# Patient Record
Sex: Female | Born: 1980 | Race: White | Hispanic: No | Marital: Single | Smoking: Former smoker
Health system: Southern US, Community
[De-identification: ages and names within clinical notes are randomized; demographics above are authoritative.]

## PROBLEM LIST (undated history)

## (undated) ENCOUNTER — Inpatient Hospital Stay (HOSPITAL_COMMUNITY): Payer: Self-pay

## (undated) DIAGNOSIS — B999 Unspecified infectious disease: Secondary | ICD-10-CM

## (undated) DIAGNOSIS — F32A Depression, unspecified: Secondary | ICD-10-CM

## (undated) DIAGNOSIS — F99 Mental disorder, not otherwise specified: Secondary | ICD-10-CM

## (undated) DIAGNOSIS — K589 Irritable bowel syndrome without diarrhea: Secondary | ICD-10-CM

## (undated) DIAGNOSIS — N926 Irregular menstruation, unspecified: Secondary | ICD-10-CM

## (undated) DIAGNOSIS — F419 Anxiety disorder, unspecified: Secondary | ICD-10-CM

## (undated) DIAGNOSIS — F329 Major depressive disorder, single episode, unspecified: Secondary | ICD-10-CM

## (undated) HISTORY — DX: Anxiety disorder, unspecified: F41.9

## (undated) HISTORY — DX: Irritable bowel syndrome, unspecified: K58.9

## (undated) HISTORY — PX: APPENDECTOMY: SHX54

## (undated) HISTORY — DX: Unspecified infectious disease: B99.9

## (undated) HISTORY — DX: Mental disorder, not otherwise specified: F99

## (undated) HISTORY — DX: Depression, unspecified: F32.A

## (undated) HISTORY — DX: Irregular menstruation, unspecified: N92.6

## (undated) HISTORY — PX: OTHER SURGICAL HISTORY: SHX169

## (undated) HISTORY — DX: Major depressive disorder, single episode, unspecified: F32.9

---
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
## 2003-09-25 ENCOUNTER — Inpatient Hospital Stay (HOSPITAL_COMMUNITY): Admission: RE | Admit: 2003-09-25 | Discharge: 2003-09-30 | Payer: Self-pay | Admitting: Psychiatry

## 2003-09-25 ENCOUNTER — Emergency Department (HOSPITAL_COMMUNITY): Admission: EM | Admit: 2003-09-25 | Discharge: 2003-09-25 | Payer: Self-pay | Admitting: Emergency Medicine

## 2003-12-09 ENCOUNTER — Other Ambulatory Visit: Admission: RE | Admit: 2003-12-09 | Discharge: 2003-12-09 | Payer: Self-pay | Admitting: Internal Medicine

## 2004-06-06 HISTORY — PX: WISDOM TOOTH EXTRACTION: SHX21

## 2004-11-16 ENCOUNTER — Other Ambulatory Visit: Admission: RE | Admit: 2004-11-16 | Discharge: 2004-11-16 | Payer: Self-pay | Admitting: Internal Medicine

## 2005-04-06 ENCOUNTER — Encounter: Admission: RE | Admit: 2005-04-06 | Discharge: 2005-04-06 | Payer: Self-pay | Admitting: Internal Medicine

## 2005-12-12 ENCOUNTER — Other Ambulatory Visit: Admission: RE | Admit: 2005-12-12 | Discharge: 2005-12-12 | Payer: Self-pay | Admitting: Internal Medicine

## 2005-12-15 ENCOUNTER — Encounter: Admission: RE | Admit: 2005-12-15 | Discharge: 2005-12-15 | Payer: Self-pay | Admitting: Internal Medicine

## 2007-04-15 IMAGING — CT CT ABDOMEN W/ CM
2 of 5 series · 17 of 46 positions shown, 19 images · IV contrast (READICAT/WATER & [ID] OMNI 300)
Comparison: none

CLINICAL DATA: 25-year-old female, right lower quadrant pain.  History of previous intestinal surgery and appendectomy at 5 weeks of age. 
ABDOMEN CT WITH CONTRAST:
TECHNIQUE: Multidetector CT imaging of the abdomen was performed following the standard protocol during bolus administration of intravenous contrast.
Contrast:  100 cc Omnipaque 300
TECHNIQUE: Multidetector CT imaging of the pelvis was performed following the standard protocol during bolus administration of intravenous contrast.

[Series 102: a&p w/ · axial · 0.62mm/px · z∈[-375,+12]mm · 14 of 420 slices shown, 16 images]
[im 17/420  soft-tissue]
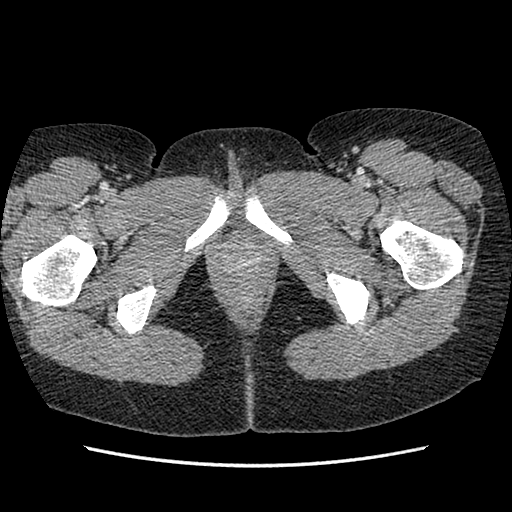
[im 17/420  bone]
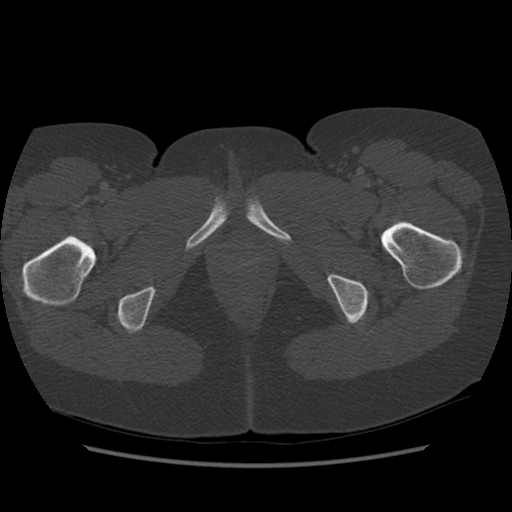
[im 51/420  soft-tissue]
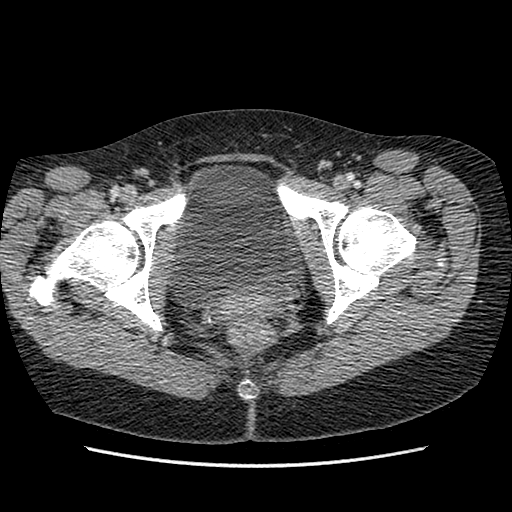
[im 84/420  soft-tissue]
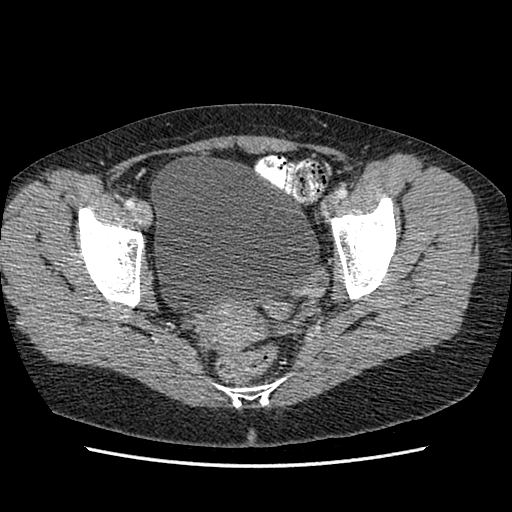
[im 118/420  soft-tissue]
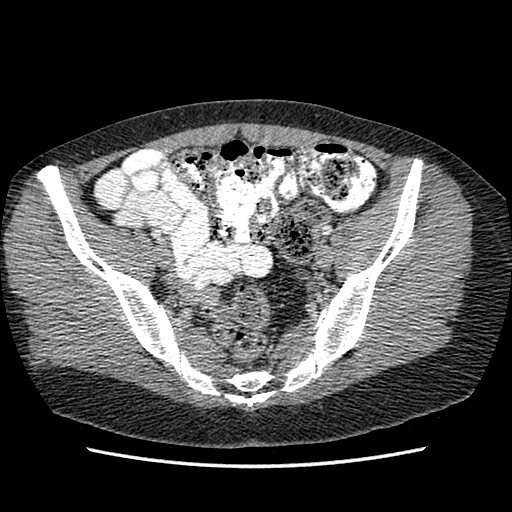
[im 135/420  soft-tissue]
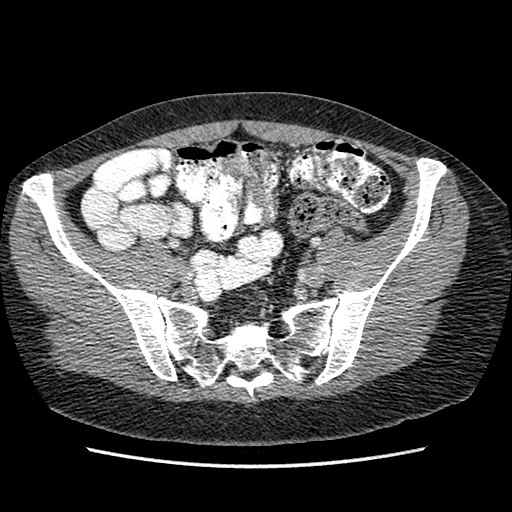
[im 168/420  soft-tissue]
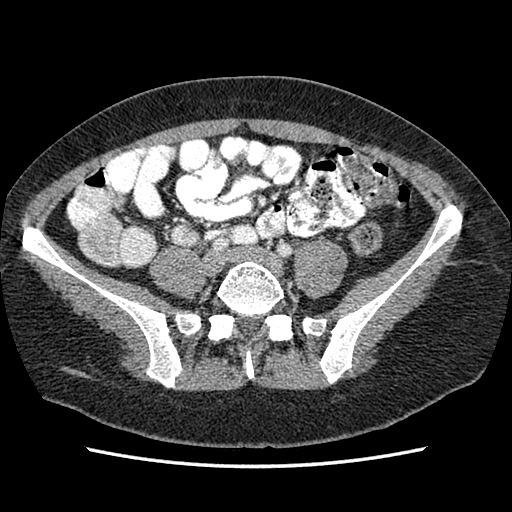
[im 202/420  soft-tissue]
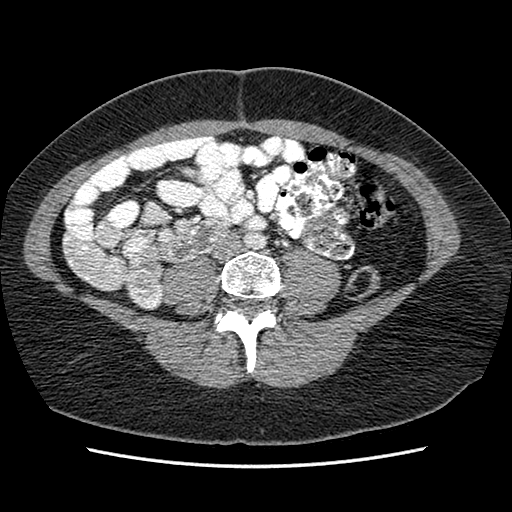
[im 218/420  soft-tissue]
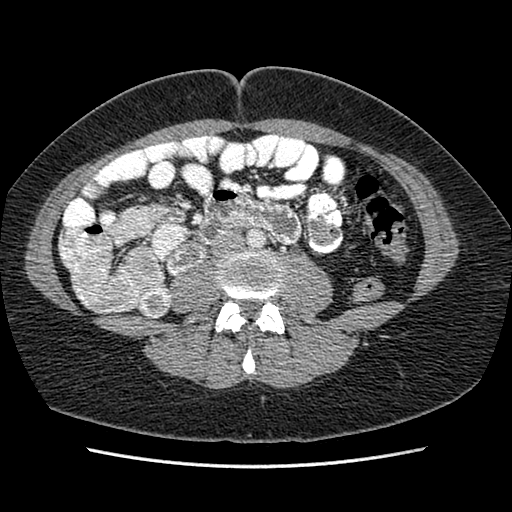
[im 252/420  soft-tissue]
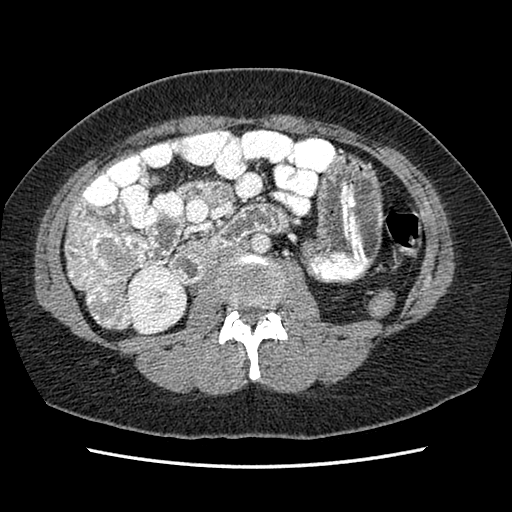
[im 252/420  bone]
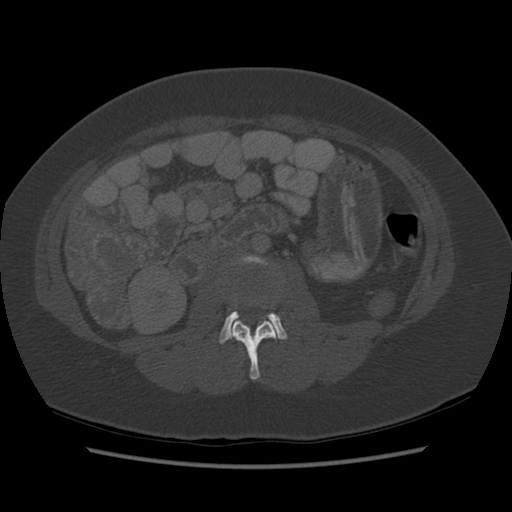
[im 285/420  soft-tissue]
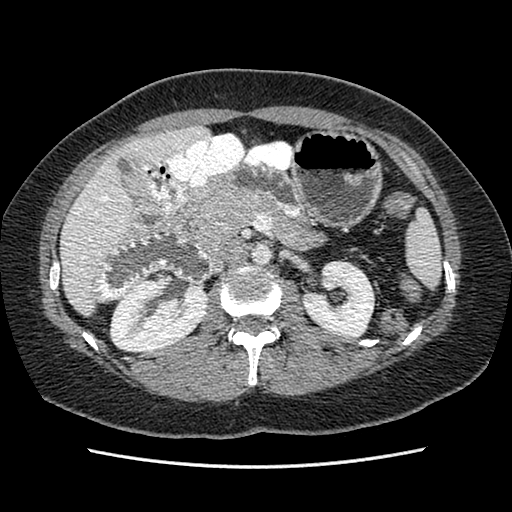
[im 319/420  soft-tissue]
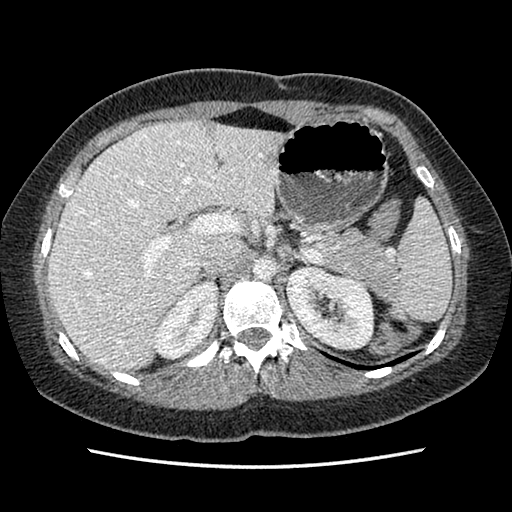
[im 336/420  soft-tissue]
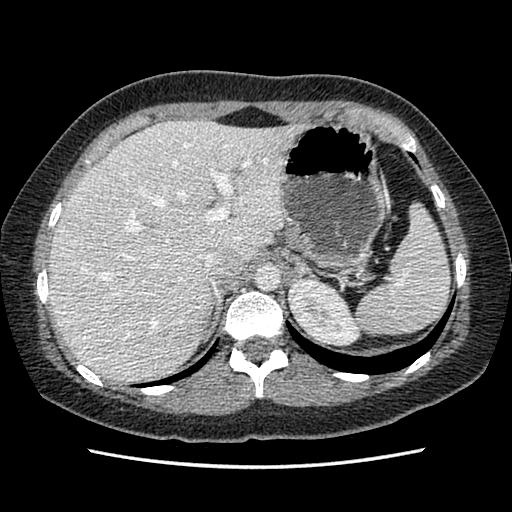
[im 369/420  soft-tissue]
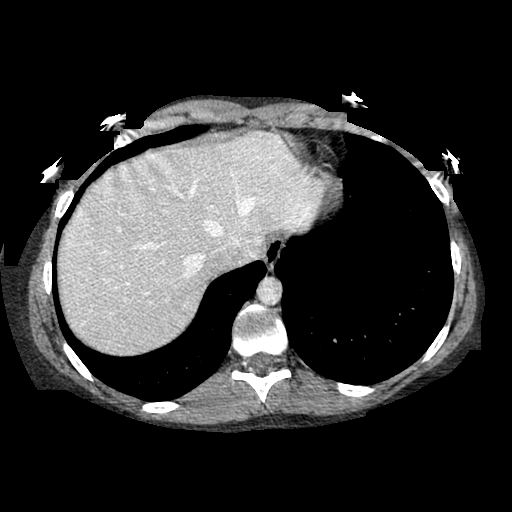
[im 403/420  soft-tissue]
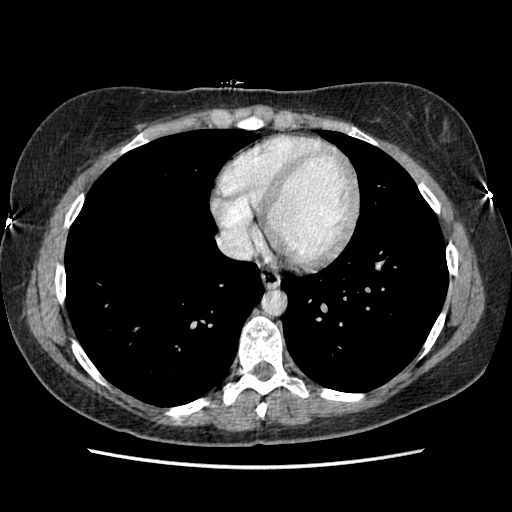

[Series 104: reformatted · coronal · 0.84mm/px · 3 of 79 slices shown]
[im 27/79  soft-tissue]
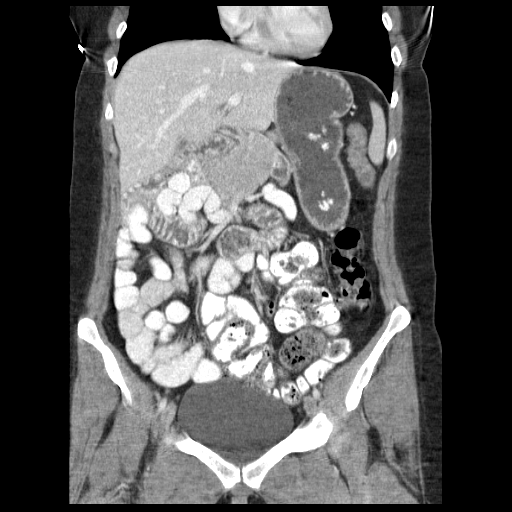
[im 35/79  soft-tissue]
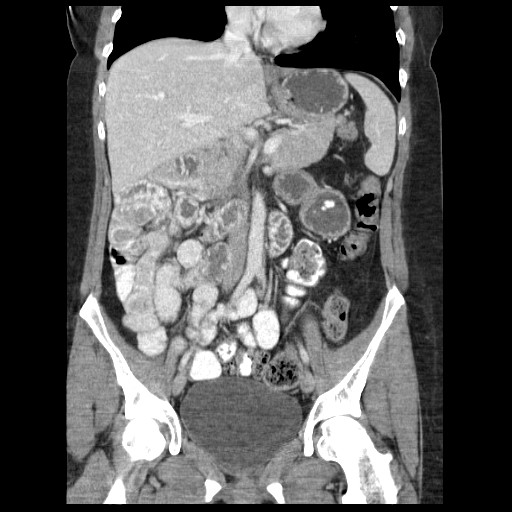
[im 44/79  soft-tissue]
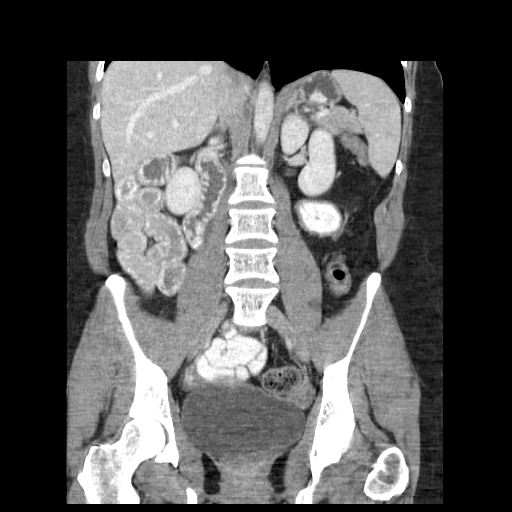

[17 of 46 positions shown; findings below may reference images not displayed]

FINDINGS: Heart size within normal limits.  Subglandular breast implants are evident bilaterally.  There is no significant pleural or pericardial effusion. 
The infused appearance of the liver is within normal limits.  Spleen and pancreas are normal.  Gallbladder is unremarkable.  Adrenal glands and kidneys are within normal limits bilaterally.  No significant abdominal lymphadenopathy or free fluid.  Small bowel is nearly all to the right of midline.  A single loop crosses midline before crossing back.  Large bowel is primarily on the left with the most proximal colon being low in the pelvis centrally.  Bone windows demonstrate no focal lytic or blastic lesions.
IMPRESSION: 1.   Malrotation of the bowel.
2.  No focal pathology to explain the patient?s pain.  
PELVIS CT WITH CONTRAST:
FINDINGS: As stated the large bowel is primarily on the left.  There is contrast material throughout the proximal colon.  Distal colon is partially collapsed although there is no focal transition point to suggest obstruction.  Urinary bladder is moderately distended.  Uterus and adnexa are unremarkable.  There is no significant pelvic free fluid or lymphadenopathy.
IMPRESSION: Malrotation of the bowel.  No focal pathology to explain the patient?s abdominal pain.

## 2008-01-08 ENCOUNTER — Other Ambulatory Visit: Admission: RE | Admit: 2008-01-08 | Discharge: 2008-01-08 | Payer: Self-pay | Admitting: Internal Medicine

## 2008-03-31 ENCOUNTER — Ambulatory Visit: Payer: Self-pay | Admitting: Internal Medicine

## 2008-06-06 DIAGNOSIS — F99 Mental disorder, not otherwise specified: Secondary | ICD-10-CM

## 2008-06-06 HISTORY — DX: Mental disorder, not otherwise specified: F99

## 2008-06-23 ENCOUNTER — Ambulatory Visit: Payer: Self-pay | Admitting: Internal Medicine

## 2008-12-04 ENCOUNTER — Ambulatory Visit: Payer: Self-pay | Admitting: Internal Medicine

## 2008-12-15 ENCOUNTER — Ambulatory Visit: Payer: Self-pay | Admitting: Internal Medicine

## 2008-12-15 ENCOUNTER — Other Ambulatory Visit: Admission: RE | Admit: 2008-12-15 | Discharge: 2008-12-15 | Payer: Self-pay | Admitting: Internal Medicine

## 2009-03-25 ENCOUNTER — Ambulatory Visit (HOSPITAL_COMMUNITY): Admission: RE | Admit: 2009-03-25 | Discharge: 2009-03-25 | Payer: Self-pay | Admitting: Orthopedic Surgery

## 2010-03-29 ENCOUNTER — Ambulatory Visit: Payer: Self-pay | Admitting: Internal Medicine

## 2010-03-29 ENCOUNTER — Other Ambulatory Visit: Admission: RE | Admit: 2010-03-29 | Discharge: 2010-03-29 | Payer: Self-pay | Admitting: Internal Medicine

## 2010-03-30 LAB — HM PAP SMEAR

## 2010-04-05 ENCOUNTER — Ambulatory Visit: Payer: Self-pay | Admitting: Internal Medicine

## 2010-10-22 NOTE — Discharge Summary (Signed)
NAME:  Mckenzie Gordon, MELKONIAN NO.:  [id number]   MEDICAL RECORD NO.:  [id number]                   PATIENT TYPE:  IPS   LOCATION:  0500                                 FACILITY:  [facility]   PHYSICIAN:  Geoffery Lyons, M.D.                   DATE OF BIRTH:  [birth year]   DATE OF ADMISSION:  09/25/2003  DATE OF DISCHARGE:  09/30/2003                                 DISCHARGE SUMMARY   CHIEF COMPLAINT AND PRESENT ILLNESS:  This was the first admission to [facility] for this 30 year old white female voluntarily  admitted.  She had a history of drug use, smoking crack for two years, crack  binge for four days.  She had a history of mood swings.  She denied any  suicidal or homicidal ideas.  She wanted help to get off substances.  She  reported racing thoughts.  She had a history of hypomania.   PAST PSYCHIATRIC HISTORY:  This was the first time at [facility].  She had inpatient rehabilitation in [place] in November 2004 for 27  days for crack cocaine.  She had a history of depression, history of  cutting.  She overdosed on Vicodin, psychotic.   SUBSTANCE ABUSE HISTORY:  The patient smoked.  Last drank one beer the day  before.  Crack cocaine, history of heroin, Lortab, ecstasy, methamphetamine,  PCP, Xanax.   PAST MEDICAL HISTORY:  History of essential tremors.   MEDICATIONS:  1. Propranolol 40 mg per day.  2. Advair twice a day.  3. Albuterol inhaler twice a day.   LABORATORY DATA:  Hemoglobin A1c 5.6.  Triglycerides 167.  RPR was  nonreactive.   MENTAL STATUS EXAM:  Mental status exam revealed an alert female,  cooperative, fair eye contact.  Speech was clear, goal oriented;  normal  rate, production, and tempo.  Mood: Anxiety.  Affect: Anxiety.  Thought  processes: Coherent; no evidence of psychosis.  Cognitive: Cognition was  well preserved.   ADMISSION DIAGNOSES:   AXIS I:  1. Cocaine dependence.  2. Polysubstance  abuse.  3. Mood disorder, not otherwise specified.   AXIS II:  No diagnosis.   AXIS III:  Essential tremors.   AXIS IV:  Moderate.   AXIS V:  Global assessment of functioning upon admission 35, highest global  assessment of functioning in the last year 60.   HOSPITAL COURSE:  She was admitted and started in intensive individual and  group psychotherapy.  She was given Ambien for sleep.  She was given  Symmetrel 100 mg twice a day.  She was detoxified with clonidine.  She was  given Zyprexa as needed.  She endorsed that she relapsed, could not say why.  She felt that her family was burnt out and they would not be helping her any  more.  She wanted to go to a residential treatment center.  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  She was dealing with the  shame and the guilt, the anxiety.  She reported hearing some voices but they  were muffled.  There was a family session where the family pretty much said  that she was not going to be allowed to go back to the house.  She became  upset.  It finally hit her that she was on her own.  She was willing to  pursue outpatient treatment, wanted to go to a residential treatment center.  There were no further hallucinations; psychosis probably secondary to the  use of cocaine.  She was more insightful afraid of relapse but was able to  open up and was dealing with triggers, relapse prevention plan.  On April  26, she was in full contact with reality.  There were no suicidal ideas, no  homicidal ideas.  Her mood had improved, her affect was brighter.  A bed  became available in [facility] and she was going to pursue further  treatment through [facility].   DISCHARGE DIAGNOSES:   AXIS I:  1. Polysubstance abuse.  2. Mood disorder, not otherwise specified.  3. Substance-induced psychotic symptoms.   AXIS II:  No diagnosis.   AXIS III:  Essential tremor.   AXIS IV:   Moderate.   AXIS V:  Global assessment of functioning upon discharge 50.   DISCHARGE MEDICATIONS:  1. Symmetrel 100 mg twice a day.  2. Trazodone 100 mg at bedtime for sleep.  3. Propranolol 40 mg per day.   FOLLOW UP:  She was being transferred to [facility] for further  residential treatment.                                               Geoffery Lyons, M.D.    IL/MEDQ  D:  10/23/2003  T:  10/24/2003  Job:  [id number]

## 2010-12-23 ENCOUNTER — Encounter: Payer: Self-pay | Admitting: Internal Medicine

## 2010-12-27 ENCOUNTER — Other Ambulatory Visit: Payer: Self-pay | Admitting: Internal Medicine

## 2010-12-28 ENCOUNTER — Encounter: Payer: Self-pay | Admitting: Internal Medicine

## 2010-12-28 ENCOUNTER — Ambulatory Visit (INDEPENDENT_AMBULATORY_CARE_PROVIDER_SITE_OTHER): Payer: 59 | Admitting: Internal Medicine

## 2010-12-28 ENCOUNTER — Other Ambulatory Visit (HOSPITAL_COMMUNITY)
Admission: RE | Admit: 2010-12-28 | Discharge: 2010-12-28 | Disposition: A | Discharge status: Home / Self Care | Payer: 59 | Source: Ambulatory Visit | Attending: Internal Medicine | Admitting: Internal Medicine

## 2010-12-28 VITALS — BP 134/84 | HR 72 | Temp 98.4°F | Ht 63.0 in | Wt 165.0 lb

## 2010-12-28 DIAGNOSIS — N760 Acute vaginitis: Secondary | ICD-10-CM

## 2010-12-28 DIAGNOSIS — G43909 Migraine, unspecified, not intractable, without status migrainosus: Secondary | ICD-10-CM

## 2010-12-28 DIAGNOSIS — F1910 Other psychoactive substance abuse, uncomplicated: Secondary | ICD-10-CM

## 2010-12-28 DIAGNOSIS — N926 Irregular menstruation, unspecified: Secondary | ICD-10-CM

## 2010-12-28 DIAGNOSIS — F988 Other specified behavioral and emotional disorders with onset usually occurring in childhood and adolescence: Secondary | ICD-10-CM | POA: Insufficient documentation

## 2010-12-28 DIAGNOSIS — Z124 Encounter for screening for malignant neoplasm of cervix: Secondary | ICD-10-CM

## 2010-12-28 DIAGNOSIS — F1911 Other psychoactive substance abuse, in remission: Secondary | ICD-10-CM | POA: Insufficient documentation

## 2010-12-28 DIAGNOSIS — Z0000 Encounter for general adult medical examination without abnormal findings: Secondary | ICD-10-CM

## 2010-12-28 DIAGNOSIS — Z01419 Encounter for gynecological examination (general) (routine) without abnormal findings: Secondary | ICD-10-CM | POA: Insufficient documentation

## 2010-12-28 LAB — COMPREHENSIVE METABOLIC PANEL
ALT: 10 U/L (ref 0–35)
AST: 15 U/L (ref 0–37)
Albumin: 4 g/dL (ref 3.5–5.2)
Alkaline Phosphatase: 57 U/L (ref 39–117)
BUN: 14 mg/dL (ref 6–23)
CO2: 23 mEq/L (ref 19–32)
Calcium: 8.9 mg/dL (ref 8.4–10.5)
Chloride: 103 mEq/L (ref 96–112)
Creat: 0.68 mg/dL (ref 0.50–1.10)
Glucose, Bld: 83 mg/dL (ref 70–99)
Potassium: 4.3 mEq/L (ref 3.5–5.3)
Sodium: 137 mEq/L (ref 135–145)
Total Bilirubin: 0.1 mg/dL — ABNORMAL LOW (ref 0.3–1.2)
Total Protein: 6.6 g/dL (ref 6.0–8.3)

## 2010-12-28 LAB — LIPID PANEL
Cholesterol: 152 mg/dL (ref 0–200)
HDL: 52 mg/dL (ref >39)
LDL Cholesterol: 84 mg/dL (ref 0–99)
Total CHOL/HDL Ratio: 2.9 Ratio
Triglycerides: 81 mg/dL (ref <150)
VLDL: 16 mg/dL (ref 0–40)

## 2010-12-28 LAB — CBC WITH DIFFERENTIAL/PLATELET
Basophils Absolute: 0 10*3/uL (ref 0.0–0.1)
Basophils Relative: 0 % (ref 0–1)
Eosinophils Absolute: 0.1 10*3/uL (ref 0.0–0.7)
Eosinophils Relative: 1 % (ref 0–5)
HCT: 43 % (ref 36.0–46.0)
Hemoglobin: 14.2 g/dL (ref 12.0–15.0)
Lymphocytes Relative: 26 % (ref 12–46)
Lymphs Abs: 2.3 10*3/uL (ref 0.7–4.0)
MCH: 29 pg (ref 26.0–34.0)
MCHC: 33 g/dL (ref 30.0–36.0)
MCV: 87.9 fL (ref 78.0–100.0)
Monocytes Absolute: 0.4 10*3/uL (ref 0.1–1.0)
Monocytes Relative: 5 % (ref 3–12)
Neutro Abs: 6.1 10*3/uL (ref 1.7–7.7)
Neutrophils Relative %: 68 % (ref 43–77)
Platelets: 368 10*3/uL (ref 150–400)
RBC: 4.89 MIL/uL (ref 3.87–5.11)
RDW: 14.1 % (ref 11.5–15.5)
WBC: 8.9 10*3/uL (ref 4.0–10.5)

## 2010-12-28 LAB — TSH: TSH: 1.574 u[IU]/mL (ref 0.350–4.500)

## 2010-12-28 NOTE — Patient Instructions (Signed)
We will obtain GYN appointment for you to have evaluated her menstrual cycle irregularities. Have refilled Imitrex  for one year.

## 2010-12-28 NOTE — Progress Notes (Signed)
  Subjective:    Patient ID: Mckenzie Gordon, female    DOB: 1980/11/24, 30 y.o.   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 Patient has a prior history of substance abuse and has done well in recovery. Continues to see Francesca Oman for counseling. History of migraine headaches, irritable bowel syndrome, remote history of asthma. History of attention deficit disorder and takes Adderall 20 mg daily during the school year. She has recently become engaged to a recent Forensic psychologist. She is sexually active. She is on oral contraceptives but has been having some trouble with irregular menses and heavy bleeding. We will refer her to GYN physician for evaluation. Also has noted a tag in her vulvar area which may be condyloma. Chest appointment see dermatologist in the near future. She has some other areas in the left perineal area that could be early condyloma as well. Had breast augmentation in 2001 by Dr. Shon Hough. History of appendectomy. Has had the Gardasil series completed in 2008.    Review of Systems  Constitutional: Negative.   HENT: Negative.   Eyes: Negative.   Respiratory: Negative.   Cardiovascular: Negative.   Gastrointestinal: Negative.   Genitourinary:       Irreg menses despite being on OCP. Recent episode of heavy bleeding. Denies noncompliance with OCP  Musculoskeletal: Negative.   Neurological: Negative.   Hematological: Negative.   Psychiatric/Behavioral: Negative.        Objective:   Physical Exam  Constitutional: She is oriented to person, place, and time. She appears well-developed and well-nourished. No distress.  HENT:  Head: Normocephalic and atraumatic.  Right Ear: External ear normal.  Left Ear: External ear normal.  Nose: Nose normal.  Mouth/Throat: Oropharynx is clear and moist. No oropharyngeal exudate.  Eyes: Conjunctivae and EOM are normal. Pupils are equal, round,  and reactive to light. Right eye exhibits no discharge. Left eye exhibits no discharge. No scleral icterus.  Neck: Normal range of motion. Neck supple. No JVD present. No thyromegaly present.  Cardiovascular: Normal rate, regular rhythm and normal heart sounds.  Exam reveals no gallop.   No murmur heard. Pulmonary/Chest: Effort normal and breath sounds normal. No respiratory distress. She has no wheezes. She has no rales.       Breasts normal female  Abdominal: Soft. Bowel sounds are normal. She exhibits no distension and no mass. There is no tenderness. There is no rebound and no guarding.  Genitourinary: Uterus normal. Vaginal discharge found.       Pap taken. White vaginal discharge.   Small tag right perineal area and papular areas left perineal area that could be early condyloma. GC and chlamydia probes taken  Musculoskeletal: She exhibits no edema.  Lymphadenopathy:    She has no cervical adenopathy.  Neurological: She is alert and oriented to person, place, and time. She has normal reflexes. No cranial nerve deficit.  Skin: No rash noted. She is not diaphoretic.  Psychiatric: She has a normal mood and affect. Her behavior is normal. Judgment and thought content normal.          Assessment & Plan:  History of migraine headaches  History of substance abuse  Attention deficit disorder  Irregular menses  History of irritable bowel syndrome  History of asthma  Plan refill Imitrex 100 mg tablets one month supply with when necessary 1 year refill  Adderall 20 mg (not XR) 1 by mouth daily #90 no refill

## 2010-12-29 ENCOUNTER — Encounter: Payer: Self-pay | Admitting: Internal Medicine

## 2010-12-29 LAB — VITAMIN D 25 HYDROXY (VIT D DEFICIENCY, FRACTURES): Vit D, 25-Hydroxy: 33 ng/mL (ref 30–89)

## 2010-12-29 LAB — GC/CHLAMYDIA PROBE AMP, GENITAL
Chlamydia, DNA Probe: NEGATIVE
GC Probe Amp, Genital: NEGATIVE

## 2011-01-04 ENCOUNTER — Telehealth: Payer: Self-pay

## 2011-01-04 NOTE — Telephone Encounter (Signed)
Patient scheduled for appointment with Dr. Freda Jackson on August 9. She is aware of this appt, and records have been sent

## 2011-01-13 ENCOUNTER — Other Ambulatory Visit: Payer: Self-pay | Admitting: Obstetrics and Gynecology

## 2011-02-24 ENCOUNTER — Encounter: Payer: Self-pay | Admitting: Internal Medicine

## 2011-02-24 ENCOUNTER — Ambulatory Visit (INDEPENDENT_AMBULATORY_CARE_PROVIDER_SITE_OTHER): Payer: 59 | Admitting: Internal Medicine

## 2011-02-24 VITALS — BP 124/86 | HR 80 | Temp 97.9°F

## 2011-02-24 DIAGNOSIS — F4321 Adjustment disorder with depressed mood: Secondary | ICD-10-CM

## 2011-02-24 DIAGNOSIS — F419 Anxiety disorder, unspecified: Secondary | ICD-10-CM

## 2011-02-24 DIAGNOSIS — F341 Dysthymic disorder: Secondary | ICD-10-CM

## 2011-02-24 DIAGNOSIS — Z23 Encounter for immunization: Secondary | ICD-10-CM

## 2011-02-27 DIAGNOSIS — F329 Major depressive disorder, single episode, unspecified: Secondary | ICD-10-CM | POA: Insufficient documentation

## 2011-02-27 NOTE — Progress Notes (Signed)
  Subjective:    Patient ID: Mckenzie Gordon, female    DOB: 01/02/1981, 30 y.o.   MRN: [id number]  HPI patient with history of substance abuse and depression being counseled by Antoine Poche recently had a settlement regarding her mother's death in a motor vehicle accident. This finalized her mother's death for her and she has been quite upset and grieving. She is worried about this. Came to talk about it. She is on Wellbutrin as a mood stabilizer. Takes trazodone to sleep. Her fiance is worried. 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 Also began to have some second thoughts about being married. No suicidal ideations. History of migraine headaches and allergic rhinitis. History of attention deficit and takes Adderall to study.    Review of Systems     Objective:   Physical Exam grieving appropriately. Able to verbalize her feelings. Not suicidal.        Assessment & Plan:  Grief reaction  Anxiety depression  History of substance abuse  Appointment with Dr. Evelene Croon for medication consultation. Increase Klonopin to 3 times a day.

## 2011-04-08 ENCOUNTER — Other Ambulatory Visit: Payer: Self-pay | Admitting: Internal Medicine

## 2011-04-22 ENCOUNTER — Other Ambulatory Visit: Payer: Self-pay

## 2011-04-22 MED ORDER — AMPHETAMINE-DEXTROAMPHETAMINE 20 MG PO TABS: 20.0000 mg | ORAL_TABLET | Freq: Every day | ORAL | Status: DC

## 2011-04-22 NOTE — Telephone Encounter (Signed)
Documented in meds and orders

## 2011-04-22 NOTE — Telephone Encounter (Signed)
Written Rx for Adderall 20mg  (not XR) #90 one  By mouth Daily with no refill

## 2011-07-25 ENCOUNTER — Other Ambulatory Visit: Payer: Self-pay

## 2011-07-25 MED ORDER — AMPHETAMINE-DEXTROAMPHETAMINE 20 MG PO TABS: 20.0000 mg | ORAL_TABLET | Freq: Every day | ORAL | Status: DC

## 2011-08-15 ENCOUNTER — Other Ambulatory Visit: Payer: 59 | Admitting: Internal Medicine

## 2011-08-15 ENCOUNTER — Telehealth: Payer: Self-pay

## 2011-08-15 DIAGNOSIS — Z3201 Encounter for pregnancy test, result positive: Secondary | ICD-10-CM

## 2011-08-15 DIAGNOSIS — N912 Amenorrhea, unspecified: Secondary | ICD-10-CM

## 2011-08-15 LAB — HCG, QUANTITATIVE, PREGNANCY: hCG, Beta Chain, Quant, S: 173.8 m[IU]/mL

## 2011-08-15 NOTE — Telephone Encounter (Signed)
Patient here for serum Hcg today, with results of 173.8. Per Dr. Lenord Fellers, informed she is approximately 2-[redacted] weeks pregnant. Advised to stop her Adderall, continue Wellbutrin, and use Alprazolam very sparingly. Also needs to quit smoking. Will  Find Ob/Gyn on her own and make an appointment.

## 2011-08-19 ENCOUNTER — Encounter: Payer: Self-pay | Admitting: Internal Medicine

## 2011-08-19 ENCOUNTER — Ambulatory Visit (INDEPENDENT_AMBULATORY_CARE_PROVIDER_SITE_OTHER): Payer: 59 | Admitting: Internal Medicine

## 2011-08-19 VITALS — BP 126/76 | HR 80 | Temp 99.3°F | Wt 187.0 lb

## 2011-08-19 DIAGNOSIS — F1911 Other psychoactive substance abuse, in remission: Secondary | ICD-10-CM

## 2011-08-19 DIAGNOSIS — Z331 Pregnant state, incidental: Secondary | ICD-10-CM

## 2011-08-19 DIAGNOSIS — Z3490 Encounter for supervision of normal pregnancy, unspecified, unspecified trimester: Secondary | ICD-10-CM

## 2011-08-19 DIAGNOSIS — F1910 Other psychoactive substance abuse, uncomplicated: Secondary | ICD-10-CM

## 2011-08-19 DIAGNOSIS — B373 Candidiasis of vulva and vagina: Secondary | ICD-10-CM

## 2011-08-19 LAB — POCT URINALYSIS DIPSTICK
Bilirubin, UA: NEGATIVE
Blood, UA: NEGATIVE
Glucose, UA: NEGATIVE
Ketones, UA: NEGATIVE
Nitrite, UA: NEGATIVE
Protein, UA: NEGATIVE
Urobilinogen, UA: NEGATIVE
pH, UA: 7

## 2011-08-19 LAB — POCT WET PREP (WET MOUNT)

## 2011-08-19 NOTE — Patient Instructions (Addendum)
Use Gyne Lotrimin vaginal cream at bedtime in vagina 1 applicatorful for 7 days. Keep taking prenatal vitamins. Please keep appointment with obstetrician.

## 2011-08-19 NOTE — Progress Notes (Signed)
  Subjective:    Patient ID: Mckenzie Gordon, female    DOB: 1980-09-16, 31 y.o.   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 Was on birth control and still got pregnant. Denied noncompliance with birth control. She has a fianc. Likely to get married after Bar exam July 2013. 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  Patient has prior history of severe vaginitis several years ago. This has gotten better recently. Now has complained of vaginal itching and vaginal odor. Says she has had vaginal discharge.    Review of Systems     Objective:   Physical Exam inspection of vagina reveals the os to be closed; cream-colored vaginal discharge. Wet prep shows yeast. No clue cells.        Assessment & Plan:  Intrauterine pregnancy  Candida vaginitis  Plan: GYN Lotrimin vaginal cream each bedtime x7 days. No refill. Patient has not yet obtained appointment with obstetrician. We will try to get appointment set up today. She is on prenatal vitamins over-the-counter currently. She has stopped smoking.

## 2011-09-28 ENCOUNTER — Ambulatory Visit (INDEPENDENT_AMBULATORY_CARE_PROVIDER_SITE_OTHER): Payer: Medicaid Other | Admitting: Obstetrics and Gynecology

## 2011-09-28 DIAGNOSIS — Z331 Pregnant state, incidental: Secondary | ICD-10-CM

## 2011-09-28 LAB — POCT URINALYSIS DIPSTICK
Bilirubin, UA: NEGATIVE
Blood, UA: NEGATIVE
Glucose, UA: NEGATIVE
Ketones, UA: NEGATIVE
Leukocytes, UA: NEGATIVE
Protein, UA: NEGATIVE
Urobilinogen, UA: NEGATIVE
pH, UA: 8.5

## 2011-09-28 NOTE — Progress Notes (Signed)
Consulted with SL re:  Hx irreg menses and vag D/C. OK TO  Sched dating U/S and f/u 10/03/11 to accomodate pt's sched.  To call with any increase in symptoms, bleeding or pain.  Pt verbalizes comprehension. To schedule NOB w/u and 1st trimester screening based on U/S results.

## 2011-09-29 LAB — PRENATAL PANEL VII
Antibody Screen: NEGATIVE
Basophils Absolute: 0 10*3/uL (ref 0.0–0.1)
Basophils Relative: 0 % (ref 0–1)
Eosinophils Absolute: 0.1 10*3/uL (ref 0.0–0.7)
Eosinophils Relative: 1 % (ref 0–5)
HCT: 41.1 % (ref 36.0–46.0)
HIV: NONREACTIVE
Lymphocytes Relative: 19 % (ref 12–46)
Lymphs Abs: 1.9 10*3/uL (ref 0.7–4.0)
MCH: 29.1 pg (ref 26.0–34.0)
MCHC: 33.1 g/dL (ref 30.0–36.0)
MCV: 88 fL (ref 78.0–100.0)
Monocytes Absolute: 0.6 10*3/uL (ref 0.1–1.0)
Neutrophils Relative %: 75 % (ref 43–77)
Platelets: 390 10*3/uL (ref 150–400)
RDW: 13.9 % (ref 11.5–15.5)
Rh Type: POSITIVE
WBC: 10.3 10*3/uL (ref 4.0–10.5)

## 2011-09-30 LAB — CULTURE, OB URINE: Colony Count: NO GROWTH

## 2011-10-03 ENCOUNTER — Encounter: Payer: Self-pay | Admitting: Obstetrics and Gynecology

## 2011-10-03 ENCOUNTER — Ambulatory Visit (INDEPENDENT_AMBULATORY_CARE_PROVIDER_SITE_OTHER): Payer: Self-pay

## 2011-10-03 ENCOUNTER — Ambulatory Visit (INDEPENDENT_AMBULATORY_CARE_PROVIDER_SITE_OTHER): Payer: Self-pay | Admitting: Obstetrics and Gynecology

## 2011-10-03 VITALS — BP 100/66 | Wt 192.0 lb

## 2011-10-03 DIAGNOSIS — Z331 Pregnant state, incidental: Secondary | ICD-10-CM

## 2011-10-03 DIAGNOSIS — B379 Candidiasis, unspecified: Secondary | ICD-10-CM | POA: Insufficient documentation

## 2011-10-03 DIAGNOSIS — B49 Unspecified mycosis: Secondary | ICD-10-CM

## 2011-10-03 LAB — POCT WET PREP (WET MOUNT): Clue Cells Wet Prep Whiff POC: NEGATIVE

## 2011-10-03 LAB — US OB COMP LESS 14 WKS

## 2011-10-03 MED ORDER — TERCONAZOLE 0.4 % VA CREA: 1.0000 | TOPICAL_CREAM | Freq: Every day | VAGINAL | Status: AC

## 2011-10-03 NOTE — Progress Notes (Signed)
Pt c/o possible bacteria infection x 2wks

## 2011-10-03 NOTE — Progress Notes (Signed)
Reviewed EDC change to [place] date 11/19 [place] 10 6/7 week IUP, ovaries WNL  F/o for NOB visit. Lavera Guise, CNM

## 2011-10-03 NOTE — Progress Notes (Signed)
  EGBUS WNL cervix pink moist wet prep +hyphae, neg clue neg trich VVC RX terazol 7 baking soda bathes discussed .Lavera Guise, CNM

## 2011-10-13 ENCOUNTER — Encounter: Payer: Self-pay | Admitting: Obstetrics and Gynecology

## 2011-10-13 ENCOUNTER — Ambulatory Visit (INDEPENDENT_AMBULATORY_CARE_PROVIDER_SITE_OTHER): Payer: Medicaid Other | Admitting: Obstetrics and Gynecology

## 2011-10-13 VITALS — BP 100/62 | Ht 64.0 in | Wt 193.0 lb

## 2011-10-13 DIAGNOSIS — Z331 Pregnant state, incidental: Secondary | ICD-10-CM

## 2011-10-13 LAB — POCT WET PREP (WET MOUNT): pH: 5

## 2011-10-13 LAB — OB RESULTS CONSOLE GC/CHLAMYDIA
Chlamydia: NEGATIVE
Gonorrhea: NEGATIVE

## 2011-10-13 NOTE — Progress Notes (Signed)
C/o vaginal odor

## 2011-10-18 LAB — PAP IG, CT-NG, RFX HPV ASCU
Chlamydia Probe Amp: NEGATIVE
GC Probe Amp: NEGATIVE

## 2011-10-20 ENCOUNTER — Other Ambulatory Visit: Payer: Self-pay | Admitting: Obstetrics and Gynecology

## 2011-10-20 ENCOUNTER — Ambulatory Visit (INDEPENDENT_AMBULATORY_CARE_PROVIDER_SITE_OTHER): Payer: Medicaid Other

## 2011-10-20 DIAGNOSIS — Z1389 Encounter for screening for other disorder: Secondary | ICD-10-CM

## 2011-10-20 DIAGNOSIS — Z331 Pregnant state, incidental: Secondary | ICD-10-CM

## 2011-10-20 LAB — US OB COMP LESS 14 WKS

## 2011-11-10 ENCOUNTER — Encounter: Payer: Self-pay | Admitting: Obstetrics and Gynecology

## 2011-11-10 ENCOUNTER — Ambulatory Visit (INDEPENDENT_AMBULATORY_CARE_PROVIDER_SITE_OTHER): Payer: Medicaid Other | Admitting: Obstetrics and Gynecology

## 2011-11-10 VITALS — BP 112/68 | Temp 98.2°F | Wt 195.0 lb

## 2011-11-10 DIAGNOSIS — Z331 Pregnant state, incidental: Secondary | ICD-10-CM

## 2011-11-10 DIAGNOSIS — N898 Other specified noninflammatory disorders of vagina: Secondary | ICD-10-CM

## 2011-11-10 DIAGNOSIS — Z3490 Encounter for supervision of normal pregnancy, unspecified, unspecified trimester: Secondary | ICD-10-CM

## 2011-11-10 LAB — POCT WET PREP (WET MOUNT)
Clue Cells Wet Prep Whiff POC: NEGATIVE
Trichomonas Wet Prep HPF POC: NEGATIVE

## 2011-11-10 MED ORDER — TERCONAZOLE 0.4 % VA CREA: TOPICAL_CREAM | VAGINAL | Status: DC

## 2011-11-10 NOTE — Progress Notes (Signed)
1)Pt c/o thin/clear vaginal d/c with odor. Also has had some cramping.  Results for orders placed in visit on 11/10/11  POCT OSOM BVBLUE TEST      Component Value Range   Bacterial Vaginosis neg    POCT OSOM TRICHOMONAS RAPID TEST      Component Value Range   Trichomonas vaginalis neg    POCT WET PREP (WET MOUNT)      Component Value Range   Source Wet Prep POC       WBC, Wet Prep HPF POC       Bacteria Wet Prep HPF POC       BACTERIA WET PREP MORPHOLOGY POC       Clue Cells Wet Prep HPF POC None     CLUE CELLS WET PREP WHIFF POC Negative Whiff     Yeast Wet Prep HPF POC Many     KOH Wet Prep POC       Trichomonas Wet Prep HPF POC neg     pH 4.5    2) Hx panic attacks.  Has had only 2 during pregnancy.  No meds required.  Will observe only for now.  RECOMMENDATION: Terazol 7 for 14 days, then once weekly.  Partner treated with otc meds AFP today Anatomy US [facility] 1 hr glucola ASAP for recurrent monilia

## 2011-11-11 ENCOUNTER — Other Ambulatory Visit: Payer: Medicaid Other

## 2011-11-11 DIAGNOSIS — Z8619 Personal history of other infectious and parasitic diseases: Secondary | ICD-10-CM

## 2011-11-11 DIAGNOSIS — Z331 Pregnant state, incidental: Secondary | ICD-10-CM

## 2011-11-11 NOTE — Progress Notes (Unsigned)
Urine=Protein- trace            Glucose-negative

## 2011-11-14 LAB — ALPHA FETOPROTEIN, MATERNAL
AFP: 62 IU/mL
MoM for AFP: 1.63
Open Spina bifida: NEGATIVE
Osb Risk: 1:1920 {titer}

## 2011-11-24 ENCOUNTER — Telehealth: Payer: Self-pay | Admitting: Obstetrics and Gynecology

## 2011-11-24 NOTE — Telephone Encounter (Signed)
TC to pt.   States has had diarrhea and loose stools 3-6x/day for 1 week.   No other SX. T-?.  No other family members ill.   Pt does not feel ill.  Has not changed diet or tried any med.  Per MK advised clear liquids x 24 hours then progress to [employer].  May try Imodium as directed.  To check T.  Call if >100.4 or no improvement in 1-2 days following above instructions.  Pt verbalizes comprehension.

## 2011-11-24 NOTE — Telephone Encounter (Signed)
Triage/electronic

## 2011-12-05 ENCOUNTER — Ambulatory Visit (INDEPENDENT_AMBULATORY_CARE_PROVIDER_SITE_OTHER): Payer: Medicaid Other

## 2011-12-05 ENCOUNTER — Ambulatory Visit (INDEPENDENT_AMBULATORY_CARE_PROVIDER_SITE_OTHER): Payer: Medicaid Other | Admitting: Obstetrics and Gynecology

## 2011-12-05 ENCOUNTER — Other Ambulatory Visit: Payer: Self-pay

## 2011-12-05 ENCOUNTER — Encounter: Payer: Self-pay | Admitting: Obstetrics and Gynecology

## 2011-12-05 VITALS — BP 112/62 | Wt 197.0 lb

## 2011-12-05 DIAGNOSIS — L293 Anogenital pruritus, unspecified: Secondary | ICD-10-CM

## 2011-12-05 DIAGNOSIS — Z3689 Encounter for other specified antenatal screening: Secondary | ICD-10-CM

## 2011-12-05 DIAGNOSIS — N898 Other specified noninflammatory disorders of vagina: Secondary | ICD-10-CM

## 2011-12-05 DIAGNOSIS — Z331 Pregnant state, incidental: Secondary | ICD-10-CM

## 2011-12-05 DIAGNOSIS — Z3490 Encounter for supervision of normal pregnancy, unspecified, unspecified trimester: Secondary | ICD-10-CM

## 2011-12-05 NOTE — Progress Notes (Signed)
Pt states she has thick discharge but it is clear, no complaints today.

## 2011-12-05 NOTE — Progress Notes (Signed)
U/S normal anatomy, female gender, cervix 3.86cm, FHR 148, nl fluid, posterior placenta, c/w dates Spec no abnl discharge Results for orders placed in visit on 11/11/11  GLUCOSE TOLERANCE, 1 HOUR      Component Value Range   Glucose, 1 Hour GTT 134  70 - 140 mg/dL  wet prep Results for orders placed in visit on 12/05/11  POCT WET PREP (WET MOUNT)      Component Value Range   Source Wet Prep POC       WBC, Wet Prep HPF POC       Bacteria Wet Prep HPF POC       BACTERIA WET PREP MORPHOLOGY POC       Clue Cells Wet Prep HPF POC       CLUE CELLS WET PREP WHIFF POC Negative Whiff     Yeast Wet Prep HPF POC       KOH Wet Prep POC       Trichomonas Wet Prep HPF POC       pH 4.0     Limited anatomy scan repeat at [facility] in 4wks

## 2011-12-06 ENCOUNTER — Telehealth: Payer: Self-pay

## 2011-12-06 LAB — POCT WET PREP (WET MOUNT): Clue Cells Wet Prep Whiff POC: NEGATIVE

## 2011-12-06 NOTE — Telephone Encounter (Signed)
Spoke to pt to let her know that I have scheduled an U/S at the next visit for F/U anatomy. A couple of areas just need better views; nothing to worry about. Pt understands. Melody Comas A

## 2011-12-20 LAB — US OB COMP + 14 WK

## 2012-01-06 ENCOUNTER — Ambulatory Visit (INDEPENDENT_AMBULATORY_CARE_PROVIDER_SITE_OTHER): Payer: Medicaid Other | Admitting: Obstetrics and Gynecology

## 2012-01-06 ENCOUNTER — Encounter: Payer: Self-pay | Admitting: Obstetrics and Gynecology

## 2012-01-06 ENCOUNTER — Ambulatory Visit (INDEPENDENT_AMBULATORY_CARE_PROVIDER_SITE_OTHER): Payer: Medicaid Other

## 2012-01-06 VITALS — BP 108/78 | Wt 205.0 lb

## 2012-01-06 DIAGNOSIS — Z3689 Encounter for other specified antenatal screening: Secondary | ICD-10-CM

## 2012-01-06 DIAGNOSIS — Z3480 Encounter for supervision of other normal pregnancy, unspecified trimester: Secondary | ICD-10-CM

## 2012-01-06 LAB — US OB FOLLOW UP

## 2012-01-06 NOTE — Progress Notes (Signed)
Pt with SOB with bending down and occ with lying down Physical Examination: General appearance - alert, well appearing, and in no distress Chest - clear to auscultation, no wheezes, rales or rhonchi, symmetric air entry CV RRR [place] for f/u anatomy EFW 1-14  cx 3.38 cm.  SIUP vtx normal cardiac views, NB and profile Pt reassured no signs of respiratory  distress glucola @[facility] 

## 2012-01-06 NOTE — Progress Notes (Signed)
Pt c/o SOB

## 2012-01-06 NOTE — Patient Instructions (Signed)
Fetal Movement Counts Patient Name: __________________________________________________ Patient Due Date: ____________________ Kick counts is highly recommended in high risk pregnancies, but it is a good idea for every pregnant woman to do. Start counting fetal movements at 28 weeks of the pregnancy. Fetal movements increase after eating a full meal or eating or drinking something sweet (the blood sugar is higher). It is also important to drink plenty of fluids (well hydrated) before doing the count. Lie on your left side because it helps with the circulation or you can sit in a comfortable chair with your arms over your belly (abdomen) with no distractions around you. DOING THE COUNT  Try to do the count the same time of day each time you do it.   Mark the day and time, then see how long it takes for you to feel 10 movements (kicks, flutters, swishes, rolls). You should have at least 10 movements within 2 hours. You will most likely feel 10 movements in much less than 2 hours. If you do not, wait an hour and count again. After a couple of days you will see a pattern.   What you are looking for is a change in the pattern or not enough counts in 2 hours. Is it taking longer in time to reach 10 movements?  SEEK MEDICAL CARE IF:  You feel less than 10 counts in 2 hours. Tried twice.   No movement in one hour.   The pattern is changing or taking longer each day to reach 10 counts in 2 hours.   You feel the baby is not moving as it usually does.  Date: ____________ Movements: ____________ Start time: ____________ Finish time: ____________  Date: ____________ Movements: ____________ Start time: ____________ Finish time: ____________ Date: ____________ Movements: ____________ Start time: ____________ Finish time: ____________ Date: ____________ Movements: ____________ Start time: ____________ Finish time: ____________ Date: ____________ Movements: ____________ Start time: ____________ Finish time:  ____________ Date: ____________ Movements: ____________ Start time: ____________ Finish time: ____________ Date: ____________ Movements: ____________ Start time: ____________ Finish time: ____________ Date: ____________ Movements: ____________ Start time: ____________ Finish time: ____________  Date: ____________ Movements: ____________ Start time: ____________ Finish time: ____________ Date: ____________ Movements: ____________ Start time: ____________ Finish time: ____________ Date: ____________ Movements: ____________ Start time: ____________ Finish time: ____________ Date: ____________ Movements: ____________ Start time: ____________ Finish time: ____________ Date: ____________ Movements: ____________ Start time: ____________ Finish time: ____________ Date: ____________ Movements: ____________ Start time: ____________ Finish time: ____________ Date: ____________ Movements: ____________ Start time: ____________ Finish time: ____________  Date: ____________ Movements: ____________ Start time: ____________ Finish time: ____________ Date: ____________ Movements: ____________ Start time: ____________ Finish time: ____________ Date: ____________ Movements: ____________ Start time: ____________ Finish time: ____________ Date: ____________ Movements: ____________ Start time: ____________ Finish time: ____________ Date: ____________ Movements: ____________ Start time: ____________ Finish time: ____________ Date: ____________ Movements: ____________ Start time: ____________ Finish time: ____________ Date: ____________ Movements: ____________ Start time: ____________ Finish time: ____________  Date: ____________ Movements: ____________ Start time: ____________ Finish time: ____________ Date: ____________ Movements: ____________ Start time: ____________ Finish time: ____________ Date: ____________ Movements: ____________ Start time: ____________ Finish time: ____________ Date: ____________ Movements:  ____________ Start time: ____________ Finish time: ____________ Date: ____________ Movements: ____________ Start time: ____________ Finish time: ____________ Date: ____________ Movements: ____________ Start time: ____________ Finish time: ____________ Date: ____________ Movements: ____________ Start time: ____________ Finish time: ____________  Date: ____________ Movements: ____________ Start time: ____________ Finish time: ____________ Date: ____________ Movements: ____________ Start time: ____________ Finish time: ____________ Date: ____________ Movements: ____________ Start time:   ____________ Finish time: ____________ Date: ____________ Movements: ____________ Start time: ____________ Finish time: ____________ Date: ____________ Movements: ____________ Start time: ____________ Finish time: ____________ Date: ____________ Movements: ____________ Start time: ____________ Finish time: ____________ Date: ____________ Movements: ____________ Start time: ____________ Finish time: ____________  Date: ____________ Movements: ____________ Start time: ____________ Finish time: ____________ Date: ____________ Movements: ____________ Start time: ____________ Finish time: ____________ Date: ____________ Movements: ____________ Start time: ____________ Finish time: ____________ Date: ____________ Movements: ____________ Start time: ____________ Finish time: ____________ Date: ____________ Movements: ____________ Start time: ____________ Finish time: ____________ Date: ____________ Movements: ____________ Start time: ____________ Finish time: ____________ Date: ____________ Movements: ____________ Start time: ____________ Finish time: ____________  Date: ____________ Movements: ____________ Start time: ____________ Finish time: ____________ Date: ____________ Movements: ____________ Start time: ____________ Finish time: ____________ Date: ____________ Movements: ____________ Start time: ____________ Finish  time: ____________ Date: ____________ Movements: ____________ Start time: ____________ Finish time: ____________ Date: ____________ Movements: ____________ Start time: ____________ Finish time: ____________ Date: ____________ Movements: ____________ Start time: ____________ Finish time: ____________ Date: ____________ Movements: ____________ Start time: ____________ Finish time: ____________  Date: ____________ Movements: ____________ Start time: ____________ Finish time: ____________ Date: ____________ Movements: ____________ Start time: ____________ Finish time: ____________ Date: ____________ Movements: ____________ Start time: ____________ Finish time: ____________ Date: ____________ Movements: ____________ Start time: ____________ Finish time: ____________ Date: ____________ Movements: ____________ Start time: ____________ Finish time: ____________ Date: ____________ Movements: ____________ Start time: ____________ Finish time: ____________ Document Released: 06/22/2006 Document Revised: 05/12/2011 Document Reviewed: 12/23/2008 Exit[employer] Patient Information 2012 [employer]. 

## 2012-02-03 ENCOUNTER — Encounter: Payer: Self-pay | Admitting: Obstetrics and Gynecology

## 2012-02-03 ENCOUNTER — Ambulatory Visit (INDEPENDENT_AMBULATORY_CARE_PROVIDER_SITE_OTHER): Payer: Medicaid Other | Admitting: Obstetrics and Gynecology

## 2012-02-03 ENCOUNTER — Other Ambulatory Visit: Payer: Medicaid Other

## 2012-02-03 VITALS — BP 118/78 | Wt 210.0 lb

## 2012-02-03 DIAGNOSIS — Z331 Pregnant state, incidental: Secondary | ICD-10-CM

## 2012-02-03 NOTE — Patient Instructions (Signed)
Fetal Monitoring, Fetal Movement Assessment Fetal movement assessment (FMA) is done by the pregnant woman herself by counting and recording the baby's movements over a certain time period. It is done to see if there are problems with the pregnancy and the baby. Identifying and correcting problems may prevent serious problems from developing with the fetus, including fetal loss. Some pregnancies are complicated by the mother's medical problems. Some of these problems are type 1 diabetes mellitus, high blood pressure and other chronic medical illnesses. This is why it is important to monitor the baby before birth.  OTHER TECHNIQUES OF MONITORING YOUR BABY BEFORE BIRTH Several tests are in use. These include:  Nonstress test (NST). This test monitors the baby's heart rate when the baby moves.   Contraction stress test (CST). This test monitors the baby's heart rate during a contraction of the uterus.   Fetal biophysical profile (BPP). This measures and evaluates 5 observations of the baby:   The nonstress test.   The baby's breathing.   The baby's movements.   The baby's muscle tone.   The amount of amniotic fluid.   Modified BPP. This measures the volume of fluid in different parts of the amniotic sac (amniotic fluid index) and the results of the nonstress test.   Umbilical artery doppler velocimetry. This evaluates the blood flow through the umbilical cord.  There are several very serious problems that cannot be predicted or detected with any of the fetal monitoring procedures. These problems include separation (abruption) of the placenta or when the fetus chokes on the umbilical cord (umbilical cord accident). Your caregiver will help you understand your tests and what they mean for you and your baby. It is your responsibility to obtain the results of your test. LET YOUR CAREGIVER KNOW ABOUT:   Any medications you are taking including prescription and over-the-counter drugs, herbs, eye  drops and creams.   If you have a fever.   If you have an infection.   If you are sick.  RISKS AND COMPLICATIONS  There are no risks or complications to the mother or fetus with FMA. BEFORE THE PROCEDURE  Do not take medications that may decrease or increase the baby's heart rate and/or movements.   Eat a full meal at least 2 hours before the test.   Do not smoke if you are pregnant. If you smoke, stop at least 2 days before the test. It is best not to smoke at all when you are pregnant.  PROCEDURE Sometimes, a mother notices her baby moves less before there are problems. Because of this, it is believed that fetal movement checking by the mother (kick counts) is a good way to check the baby before birth. There are different ways of doing this. Two good ways are:  The woman lies on her side and counts distinct (individual) fetal movements. A feeling of 10 distinct movements in a period of up to 2 hours is considered reassuring. When 10 movements are felt, you may stop counting.   Women are instructed to count fetal movements for 1 hour, three times per week. The count is good if, after one week, it equals or is over the woman's previously established baseline count. If the count is lower, further checking of your baby is needed.  AFTER THE PROCEDURE You may resume your usual activities. HOME CARE INSTRUCTIONS   Follow your caregiver's advice and recommendations.   Be aware of your baby's movements. Are they normal, less than usual or more than usual?     Make and keep the rest of your prenatal appointments.  SEEK MEDICAL CARE IF:   You develop a temperature of 100 F (37.8 C) or higher.   You have a bloody mucus discharge from the vagina (a bloody show).  SEEK IMMEDIATE MEDICAL CARE IF:   You do not feel the baby move.   You think the baby's movements are too little or too many.   You develop contractions.   You develop vaginal bleeding.   You have belly (abdominal) pain.    You have leaking or a gush of fluid from the vagina.  Document Released: 05/13/2002 Document Revised: 05/12/2011 Document Reviewed: 09/15/2008 Exit[employer] Patient Information 2012 [employer]. 

## 2012-02-03 NOTE — Progress Notes (Signed)
A/P Glucola, hemoglobin and RPR today Fetal kick counts reviewed All patients questions answered Return in two weeks Continue Prenatal vitamins Blood type O positive

## 2012-02-03 NOTE — Progress Notes (Signed)
Glucola given at 10:00 Glucola due at 11:00

## 2012-02-17 ENCOUNTER — Encounter: Payer: Self-pay | Admitting: Obstetrics and Gynecology

## 2012-02-17 ENCOUNTER — Ambulatory Visit (INDEPENDENT_AMBULATORY_CARE_PROVIDER_SITE_OTHER): Payer: Medicaid Other | Admitting: Obstetrics and Gynecology

## 2012-02-17 VITALS — BP 110/64 | Wt 212.0 lb

## 2012-02-17 DIAGNOSIS — Z331 Pregnant state, incidental: Secondary | ICD-10-CM

## 2012-02-17 NOTE — Progress Notes (Signed)
Results for orders placed in visit on 02/03/12  GLUCOSE TOLERANCE, 1 HOUR (50G) W/O FASTING      Component Value Range   Glucose, 1 Hour GTT 112  70 - 140 mg/dL  HEMOGLOBIN      Component Value Range   Hemoglobin 11.9 (*) 12.0 - 15.0 g/dL  RPR      Component Value Range   RPR NON REAC  NON REAC Pt c/o reflux.  Try zantac prna nd reflux precautions given.  Reviewed results with the pt Continue FKC RT 2 weeks

## 2012-02-17 NOTE — Patient Instructions (Signed)
Heartburn During Pregnancy  Heartburn is a burning sensation in the chest caused by stomach acid backing up into the esophagus. Heartburn (also known as "reflux") is common in pregnancy because a certain hormone (progesterone) changes. The progesterone hormone may relax the valve that separates the esophagus from the stomach. This allows acid to go up into the esophagus, causing heartburn. Heartburn may also happen in pregnancy because the enlarging uterus pushes up on the stomach, which pushes more acid into the esophagus. This is especially true in the later stages of pregnancy. Heartburn problems usually go away after giving birth. CAUSES   The progesterone hormone.   Changing hormone levels.   The growing uterus that pushes stomach acid upward.   Large meals.   Certain foods and drinks.   Exercise.   Increased acid production.  SYMPTOMS   Burning pain in the chest or lower throat.   Bitter taste in the mouth.   Coughing.  DIAGNOSIS  Heartburn is typically diagnosed by your caregiver when taking a careful history of your concern. Your caregiver may order a blood test to check for a certain type of bacteria that is associated with heartburn. Sometimes, heartburn is diagnosed by prescribing a heartburn medicine to see if the symptoms improve. It is rare in pregnancy to have a procedure called an endoscopy. This is when a tube with a light and a camera on the end is used to examine the esophagus and the stomach. TREATMENT   Your caregiver may tell you to use certain over-the-counter medicines (antacids, acid reducers) for mild heartburn.   Your caregiver may prescribe medicines to decrease stomach acid or to protect your stomach lining.   Your caregiver may recommend certain diet changes.   For severe cases, your caregiver may recommend that the head of the bed be elevated on blocks. (Sleeping with more pillows is not an effective treatment as it only changes the position of your  head and does not improve the main problem of stomach acid refluxing into the esophagus.)  HOME CARE INSTRUCTIONS   Take all medicines as directed by your caregiver.   Raise the head of your bed by putting blocks under the legs if instructed to by your caregiver.   Do not exercise right after eating.   Avoid eating 2 or 3 hours before bed. Do not lie down right after eating.   Eat small meals throughout the day instead of 3 large meals.   Identify foods and beverages that make your symptoms worse and avoid them. Foods you may want to avoid include:   Peppers.   Chocolate.   High-fat foods, including fried foods.   Spicy foods.   Garlic and onions.   Citrus fruits, including oranges, grapefruit, lemons, and limes.   Food containing tomatoes or tomato products.   Mint.   Carbonated and caffeinated drinks.   Vinegar.  SEEK IMMEDIATE MEDICAL CARE IF:   You have severe chest pain that goes down your arm or into your jaw or neck.   You feel sweaty, dizzy, or lightheaded.   You become short of breath.   You vomit blood.   You have difficulty or pain with swallowing.   You have bloody or black, tarry stools.   You have episodes of heartburn more than 3 times a week, for more than 2 weeks.  MAKE SURE YOU:  Understand these instructions.   Will watch your condition.   Will get help right away if you are not doing well or   get worse.  Document Released: 05/20/2000 Document Revised: 05/12/2011 Document Reviewed: 11/11/2010 [facility] Patient Information 2012 [place], [place].

## 2012-03-01 DIAGNOSIS — O139 Gestational [pregnancy-induced] hypertension without significant proteinuria, unspecified trimester: Secondary | ICD-10-CM

## 2012-03-02 ENCOUNTER — Ambulatory Visit (INDEPENDENT_AMBULATORY_CARE_PROVIDER_SITE_OTHER): Payer: Medicaid Other | Admitting: Obstetrics and Gynecology

## 2012-03-02 ENCOUNTER — Encounter (HOSPITAL_COMMUNITY): Payer: Self-pay | Admitting: Obstetrics and Gynecology

## 2012-03-02 ENCOUNTER — Inpatient Hospital Stay (HOSPITAL_COMMUNITY)
Admission: AD | Admit: 2012-03-02 | Discharge: 2012-03-02 | Disposition: A | Discharge status: Home / Self Care | Payer: Medicaid Other | Source: Ambulatory Visit | Attending: Obstetrics and Gynecology | Admitting: Obstetrics and Gynecology

## 2012-03-02 ENCOUNTER — Encounter: Payer: Self-pay | Admitting: Obstetrics and Gynecology

## 2012-03-02 VITALS — BP 138/82 | Wt 215.0 lb

## 2012-03-02 DIAGNOSIS — O139 Gestational [pregnancy-induced] hypertension without significant proteinuria, unspecified trimester: Secondary | ICD-10-CM | POA: Diagnosis not present

## 2012-03-02 DIAGNOSIS — Z331 Pregnant state, incidental: Secondary | ICD-10-CM

## 2012-03-02 LAB — CBC
Hemoglobin: 12.1 g/dL (ref 12.0–15.0)
MCHC: 33.3 g/dL (ref 30.0–36.0)
Platelets: 331 10*3/uL (ref 150–400)
RBC: 4.38 MIL/uL (ref 3.87–5.11)

## 2012-03-02 LAB — COMPREHENSIVE METABOLIC PANEL
ALT: 13 U/L (ref 0–35)
AST: 16 U/L (ref 0–37)
Alkaline Phosphatase: 124 U/L — ABNORMAL HIGH (ref 39–117)
CO2: 25 mEq/L (ref 19–32)
Calcium: 9.6 mg/dL (ref 8.4–10.5)
GFR calc Af Amer: >90 mL/min (ref >90)
Glucose, Bld: 98 mg/dL (ref 70–99)
Potassium: 4.1 mEq/L (ref 3.5–5.1)
Sodium: 135 mEq/L (ref 135–145)
Total Protein: 7 g/dL (ref 6.0–8.3)

## 2012-03-02 LAB — URINALYSIS, ROUTINE W REFLEX MICROSCOPIC
Nitrite: NEGATIVE
Specific Gravity, Urine: 1.01 (ref 1.005–1.030)
Urobilinogen, UA: 0.2 mg/dL (ref 0.0–1.0)
pH: 6.5 (ref 5.0–8.0)

## 2012-03-02 LAB — URINE MICROSCOPIC-ADD ON

## 2012-03-02 NOTE — MAU Note (Signed)
Patient states she was seen in the office today for a regular visit. Blood pressure was elevated and she was sent to MAU for evaluation. States she has had allergy headaches for a couple of weeks. No visual problems. Reports good fetal movement. Denies any contractions, bleeding or leaking. Slight discharge.

## 2012-03-02 NOTE — MAU Provider Note (Signed)
History   31 yo G1P0 at 9 3/7 weeks presented from office for BP evaluation and NST.  Seen for regular appointment today, with c/o HA for 2 weeks.  BP slightly elevated at 140/90.  Sent to MAU for further evaluation.  Per Dr. Estanislado Pandy, if BP stable and labs WNL, to be sent home to do 24 hour urine.  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  Moving to [place] [employer] after delivery.  Patient Active Problem List  Diagnosis  . History of substance abuse  . Migraine headache  . Attention deficit disorder  . Anxiety and depression  . Yeast infection     Chief Complaint  Patient presents with  . Hypertension    OB History    Grav Para Term Preterm Abortions TAB SAB Ect Mult Living   1               Past Medical History  Diagnosis Date  . IBS (irritable bowel syndrome)   . Anxiety     KNONAPIN DR Lenord Fellers  . Migraine     IMITRX  . Asthma     CHILDHOOD EXERCISE INDUCED  . Mental disorder 2010    ADHD  . Depression AGE 31    WELBUTRIN  . Infection     UTI HX FREQUENT  . Infection     YEAST AND BV OCC  . Infection     CHLAMYDIA  . Irregular menses     Past Surgical History  Procedure Date  . Breast surgery 12/1999    augmentation   . Wisdom tooth extraction 2006  . 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 Intestine surgery     Family History  Problem Relation Age of Onset  . Hypertension Maternal Uncle   . Hypertension Maternal Grandmother   . Heart disease Maternal Grandfather   . Hypertension Maternal Grandfather   . Other Maternal Grandfather     HEPATITIS B  . Hypertension Mother   . Alcohol abuse Mother   . Other Father     EPILEPSY  . Other Paternal Grandmother     EPILEPSY  . Hypertension Paternal Grandfather   . Other Paternal Uncle     EPILEPSY  . Other Cousin     EPILEPSY  . Rheum arthritis Cousin     History  Substance Use Topics  . Smoking status: Former Smoker -- 0.3 packs/day for 15  years    Types: Cigarettes    Quit date: 08/15/2011  . Smokeless tobacco: Never Used  . Alcohol Use: Yes     social    Allergies: No Known Allergies  Prescriptions prior to admission  Medication Sig Dispense Refill  . Prenatal Vit-Fe Fumarate-FA (PRENATAL MULTIVITAMIN) TABS Take 1 tablet by mouth daily. OTC      . terconazole (TERAZOL 7) 0.4 % vaginal cream Pt to insert 1 applicator full qhs x 14 days  45 g  0     Physical Exam   Blood pressure 130/83, pulse 101, temperature 98.6 F (37 C), temperature source Oral, resp. rate 16, height 5\' 3"  (1.6 m), weight 212 lb 9.6 oz (96.435 kg), last menstrual period 06/21/2011, SpO2 100.00%.   ED Course  IUP at 32 3/7 weeks PIH w/u  Plan: NST, serial BPs, PIH labs, UA Currently no room in MAU--will check labs and await room availablity in MAU for further evaluation.   Nigel Bridgeman CNM, [place] 03/02/2012 1:26 PM  Addendum: Now in exam room in MAU.   Filed Vitals:   03/02/12 1351 03/02/12 1403 03/02/12 1418 03/02/12 1433  BP: 137/80 115/71 124/79 123/88  Pulse: 94 89 99 106  Temp:      TempSrc:      Resp:      Height:      Weight:      SpO2:       Results for orders placed during the hospital encounter of 03/02/12 (from the past 24 hour(s))  URINALYSIS, ROUTINE W REFLEX MICROSCOPIC     Status: Abnormal   Collection Time   03/02/12 12:50 PM      Component Value Range   Color, Urine YELLOW  YELLOW   APPearance CLEAR  CLEAR   Specific Gravity, Urine 1.010  1.005 - 1.030   pH 6.5  5.0 - 8.0   Glucose, UA NEGATIVE  NEGATIVE mg/dL   Hgb urine dipstick NEGATIVE  NEGATIVE   Bilirubin Urine NEGATIVE  NEGATIVE   Ketones, ur NEGATIVE  NEGATIVE mg/dL   Protein, ur NEGATIVE  NEGATIVE mg/dL   Urobilinogen, UA 0.2  0.0 - 1.0 mg/dL   Nitrite NEGATIVE  NEGATIVE   Leukocytes, UA SMALL (*) NEGATIVE  URINE MICROSCOPIC-ADD ON     Status: Abnormal   Collection Time   03/02/12 12:50 PM      Component Value Range   Squamous Epithelial /  LPF FEW (*) RARE   WBC, UA 3-6  <3 WBC/hpf   Bacteria, UA RARE  RARE  CBC     Status: Abnormal   Collection Time   03/02/12 12:55 PM      Component Value Range   WBC 16.5 (*) 4.0 - 10.5 K/uL   RBC 4.38  3.87 - 5.11 MIL/uL   Hemoglobin 12.1  12.0 - 15.0 g/dL   HCT 96.0  45.4 - 09.8 %   MCV 82.9  78.0 - 100.0 fL   MCH 27.6  26.0 - 34.0 pg   MCHC 33.3  30.0 - 36.0 g/dL   RDW 11.9  14.7 - 82.9 %   Platelets 331  150 - 400 K/uL  COMPREHENSIVE METABOLIC PANEL     Status: Abnormal   Collection Time   03/02/12 12:55 PM      Component Value Range   Sodium 135  135 - 145 mEq/L   Potassium 4.1  3.5 - 5.1 mEq/L   Chloride 101  96 - 112 mEq/L   CO2 25  19 - 32 mEq/L   Glucose, Bld 98  70 - 99 mg/dL   BUN 6  6 - 23 mg/dL   Creatinine, Ser 5.62 (*) 0.50 - 1.10 mg/dL   Calcium 9.6  8.4 - 13.0 mg/dL   Total Protein 7.0  6.0 - 8.3 g/dL   Albumin 2.9 (*) 3.5 - 5.2 g/dL   AST 16  0 - 37 U/L   ALT 13  0 - 35 U/L   Alkaline Phosphatase 124 (*) 39 - 117 U/L   Total Bilirubin 0.1 (*) 0.3 - 1.2 mg/dL   GFR calc non Af Amer >90  >90 mL/min   GFR calc Af Amer >90  >90 mL/min  LACTATE DEHYDROGENASE     Status: Normal   Collection Time   03/02/12 12:55 PM      Component Value Range   LDH 168  94 - 250 U/L  URIC ACID     Status: Normal   Collection Time   03/02/12 12:55 PM  Component Value Range   Uric Acid, Serum 3.7  2.4 - 7.0 mg/dL   BPs [id number]/95-62.  Chest clear Heart RRR without murmur Abd gravid, NT Pelvic--deferred Ext--DTR 2+ without clonus, 1+ edema.  FHR Category 1 No contractions.  D/C'd home with instructions to begin 24 hour urine on Saturday, complete Sunday, and bring to MAU for 24 hour urine protein evaluation, creatnine clearance, and creatnine.  Will do BP check on Sunday, as well. Patient able to rest at home--not working or in school at present. PIH precautions reviewed.  Nigel Bridgeman, CNM 03/02/12 3p

## 2012-03-02 NOTE — Progress Notes (Signed)
[redacted]w[redacted]d Pt c/o constant headache x 2 weeks.

## 2012-03-02 NOTE — Progress Notes (Signed)
[redacted]w[redacted]d GFM BP rechecked at 140/90: reports constant headache for 2 weeks, no visual symptoms, no epigastric pain To MAU for BP, PIH labs and NST  CNM called

## 2012-03-04 ENCOUNTER — Other Ambulatory Visit: Payer: Self-pay | Admitting: Obstetrics and Gynecology

## 2012-03-04 ENCOUNTER — Inpatient Hospital Stay (HOSPITAL_COMMUNITY)
Admission: AD | Admit: 2012-03-04 | Discharge: 2012-03-04 | Disposition: A | Discharge status: Home / Self Care | Payer: Medicaid Other | Source: Ambulatory Visit | Attending: Obstetrics and Gynecology | Admitting: Obstetrics and Gynecology

## 2012-03-04 DIAGNOSIS — O10019 Pre-existing essential hypertension complicating pregnancy, unspecified trimester: Secondary | ICD-10-CM | POA: Insufficient documentation

## 2012-03-04 DIAGNOSIS — O139 Gestational [pregnancy-induced] hypertension without significant proteinuria, unspecified trimester: Secondary | ICD-10-CM

## 2012-03-04 LAB — PROTEIN, URINE, 24 HOUR
Protein, 24H Urine: 108 mg/d — ABNORMAL HIGH (ref 50–100)
Protein, Urine: 6 mg/dL

## 2012-03-04 LAB — CREATININE CLEARANCE, URINE, 24 HOUR
Collection Interval-CRCL: 24 hours
Creatinine Clearance: 208 mL/min — ABNORMAL HIGH (ref 75–115)
Creatinine, Urine: 81.7 mg/dL
Urine Total Volume-CRCL: 1800 mL

## 2012-03-04 NOTE — MAU Note (Signed)
Patient to MAU to return a 24 hour urine specimen, have a blood pressure check and fetal heart tone.Denies any contractions or symptoms of elevated blood pressure.

## 2012-03-05 ENCOUNTER — Telehealth: Payer: Self-pay | Admitting: Obstetrics and Gynecology

## 2012-03-05 NOTE — Telephone Encounter (Signed)
TC from pt. Per VL, informed 24 hour urine result WNL. +FM.

## 2012-03-05 NOTE — Telephone Encounter (Signed)
Message copied by Mason Jim on Mon Mar 05, 2012  8:50 AM ------      Message from: Cornelius Moras      Created: Sun Mar 04, 2012  4:58 PM      Regarding: 24 hour urine       Please call patient in inform of 24 hour urine result WNL.            Thanks!

## 2012-03-05 NOTE — Telephone Encounter (Signed)
TC to pt. LM to return call.  

## 2012-03-05 NOTE — Telephone Encounter (Signed)
Message copied by Mason Jim on Mon Mar 05, 2012  9:54 AM ------      Message from: Cornelius Moras      Created: Sun Mar 04, 2012  4:58 PM      Regarding: 24 hour urine       Please call patient in inform of 24 hour urine result WNL.            Thanks!

## 2012-03-09 ENCOUNTER — Ambulatory Visit (INDEPENDENT_AMBULATORY_CARE_PROVIDER_SITE_OTHER): Payer: Medicaid Other | Admitting: Obstetrics and Gynecology

## 2012-03-09 VITALS — BP 120/64 | Wt 217.0 lb

## 2012-03-09 DIAGNOSIS — Z3490 Encounter for supervision of normal pregnancy, unspecified, unspecified trimester: Secondary | ICD-10-CM

## 2012-03-09 DIAGNOSIS — Z331 Pregnant state, incidental: Secondary | ICD-10-CM

## 2012-03-09 NOTE — Progress Notes (Signed)
[redacted]w[redacted]d No ha or visual changes today, pt thinks it was her allergies fkcs and labor precautions Several questions answered

## 2012-03-23 ENCOUNTER — Ambulatory Visit (INDEPENDENT_AMBULATORY_CARE_PROVIDER_SITE_OTHER): Payer: Medicaid Other | Admitting: Obstetrics and Gynecology

## 2012-03-23 ENCOUNTER — Encounter: Payer: Self-pay | Admitting: Obstetrics and Gynecology

## 2012-03-23 ENCOUNTER — Encounter: Payer: Medicaid Other | Admitting: Obstetrics and Gynecology

## 2012-03-23 VITALS — BP 126/78 | Temp 98.5°F | Wt 217.0 lb

## 2012-03-23 DIAGNOSIS — Z331 Pregnant state, incidental: Secondary | ICD-10-CM

## 2012-03-23 DIAGNOSIS — Z3490 Encounter for supervision of normal pregnancy, unspecified, unspecified trimester: Secondary | ICD-10-CM

## 2012-03-23 DIAGNOSIS — Z23 Encounter for immunization: Secondary | ICD-10-CM

## 2012-03-23 NOTE — Progress Notes (Signed)
[redacted]w[redacted]d Flu shot today No complaints FKCs GBS at [place] RTO 1wk

## 2012-03-30 ENCOUNTER — Encounter: Payer: Self-pay | Admitting: Obstetrics and Gynecology

## 2012-03-30 ENCOUNTER — Ambulatory Visit (INDEPENDENT_AMBULATORY_CARE_PROVIDER_SITE_OTHER): Payer: Medicaid Other | Admitting: Obstetrics and Gynecology

## 2012-03-30 VITALS — BP 124/68 | Wt 217.0 lb

## 2012-03-30 DIAGNOSIS — Z331 Pregnant state, incidental: Secondary | ICD-10-CM

## 2012-03-30 NOTE — Progress Notes (Signed)
Doing well.  Reviewed birth questions. Moving to [place] approx 1-2 weeks after delivery--has peds there already. Will use hospital peds at delivery. Desires cord blood donation. GBS today.  Cultures deferred. Cervix closed, 50%, vtx, -1 low in pelvis, cx posterior.

## 2012-03-30 NOTE — Progress Notes (Signed)
[redacted]w[redacted]d  GBS today.   

## 2012-04-01 LAB — STREP B DNA PROBE: GBSP: NEGATIVE

## 2012-04-04 ENCOUNTER — Encounter: Payer: Medicaid Other | Admitting: Obstetrics and Gynecology

## 2012-04-05 ENCOUNTER — Encounter: Payer: Self-pay | Admitting: Obstetrics and Gynecology

## 2012-04-05 ENCOUNTER — Ambulatory Visit (INDEPENDENT_AMBULATORY_CARE_PROVIDER_SITE_OTHER): Payer: Medicaid Other | Admitting: Obstetrics and Gynecology

## 2012-04-05 VITALS — BP 110/78 | Wt 213.0 lb

## 2012-04-05 DIAGNOSIS — Z331 Pregnant state, incidental: Secondary | ICD-10-CM

## 2012-04-05 DIAGNOSIS — Z3490 Encounter for supervision of normal pregnancy, unspecified, unspecified trimester: Secondary | ICD-10-CM

## 2012-04-05 NOTE — Progress Notes (Signed)
GBS negative. Doing well--decreased appetite, encouraged to maintain good fluid intake. Vtx low in pelvis.

## 2012-04-05 NOTE — Progress Notes (Signed)
[redacted]w[redacted]d Pt wants to know whether or not her mucous plug expelled. Concerned about 4lb wt loss this visit.

## 2012-04-06 ENCOUNTER — Inpatient Hospital Stay (HOSPITAL_COMMUNITY)
Admission: AD | Admit: 2012-04-06 | Discharge: 2012-04-10 | DRG: 765 | Disposition: A | Discharge status: Home / Self Care | Payer: Medicaid Other | Source: Ambulatory Visit | Attending: Obstetrics and Gynecology | Admitting: Obstetrics and Gynecology

## 2012-04-06 ENCOUNTER — Telehealth: Payer: Self-pay

## 2012-04-06 ENCOUNTER — Encounter (HOSPITAL_COMMUNITY): Payer: Self-pay | Admitting: *Deleted

## 2012-04-06 ENCOUNTER — Encounter (HOSPITAL_COMMUNITY): Payer: Self-pay | Admitting: Anesthesiology

## 2012-04-06 DIAGNOSIS — F32A Depression, unspecified: Secondary | ICD-10-CM | POA: Diagnosis present

## 2012-04-06 DIAGNOSIS — IMO0002 Reserved for concepts with insufficient information to code with codable children: Secondary | ICD-10-CM | POA: Diagnosis present

## 2012-04-06 DIAGNOSIS — F410 Panic disorder [episodic paroxysmal anxiety] without agoraphobia: Secondary | ICD-10-CM | POA: Diagnosis not present

## 2012-04-06 DIAGNOSIS — D649 Anemia, unspecified: Secondary | ICD-10-CM | POA: Diagnosis not present

## 2012-04-06 DIAGNOSIS — O1490 Unspecified pre-eclampsia, unspecified trimester: Secondary | ICD-10-CM | POA: Diagnosis present

## 2012-04-06 DIAGNOSIS — O9903 Anemia complicating the puerperium: Secondary | ICD-10-CM | POA: Diagnosis not present

## 2012-04-06 DIAGNOSIS — O139 Gestational [pregnancy-induced] hypertension without significant proteinuria, unspecified trimester: Secondary | ICD-10-CM

## 2012-04-06 DIAGNOSIS — Z98891 History of uterine scar from previous surgery: Secondary | ICD-10-CM

## 2012-04-06 DIAGNOSIS — F419 Anxiety disorder, unspecified: Secondary | ICD-10-CM | POA: Diagnosis present

## 2012-04-06 LAB — COMPREHENSIVE METABOLIC PANEL
ALT: 17 U/L (ref 0–35)
AST: 18 U/L (ref 0–37)
Albumin: 2.8 g/dL — ABNORMAL LOW (ref 3.5–5.2)
CO2: 21 mEq/L (ref 19–32)
Chloride: 99 mEq/L (ref 96–112)
Creatinine, Ser: 0.56 mg/dL (ref 0.50–1.10)
GFR calc non Af Amer: >90 mL/min (ref >90)
Sodium: 133 mEq/L — ABNORMAL LOW (ref 135–145)
Total Bilirubin: 0.2 mg/dL — ABNORMAL LOW (ref 0.3–1.2)

## 2012-04-06 LAB — URINE MICROSCOPIC-ADD ON

## 2012-04-06 LAB — URINALYSIS, ROUTINE W REFLEX MICROSCOPIC
Nitrite: NEGATIVE
Protein, ur: NEGATIVE mg/dL
Specific Gravity, Urine: 1.01 (ref 1.005–1.030)
Urobilinogen, UA: 0.2 mg/dL (ref 0.0–1.0)

## 2012-04-06 LAB — URIC ACID: Uric Acid, Serum: 4.9 mg/dL (ref 2.4–7.0)

## 2012-04-06 LAB — CBC
MCV: 81.4 fL (ref 78.0–100.0)
Platelets: 324 10*3/uL (ref 150–400)
RBC: 4.68 MIL/uL (ref 3.87–5.11)
RDW: 14.6 % (ref 11.5–15.5)
WBC: 11.9 10*3/uL — ABNORMAL HIGH (ref 4.0–10.5)

## 2012-04-06 LAB — TYPE AND SCREEN: ABO/RH(D): O POS

## 2012-04-06 LAB — ABO/RH: ABO/RH(D): O POS

## 2012-04-06 MED ORDER — ACETAMINOPHEN 325 MG PO TABS: 650.0000 mg | ORAL_TABLET | ORAL | Status: DC | PRN

## 2012-04-06 MED ORDER — LIDOCAINE HCL (PF) 1 % IJ SOLN: 30.0000 mL | INTRAMUSCULAR | Status: DC | PRN

## 2012-04-06 MED ORDER — MORPHINE SULFATE 10 MG/ML IJ SOLN: 10.0000 mg | Freq: Once | INTRAMUSCULAR | Status: DC

## 2012-04-06 MED ORDER — EPHEDRINE 5 MG/ML INJ
10.0000 mg | INTRAVENOUS | Status: DC | PRN
  Filled 2012-04-06: qty 4

## 2012-04-06 MED ORDER — LABETALOL HCL 5 MG/ML IV SOLN
10.0000 mg | INTRAVENOUS | Status: DC | PRN
  Administered 2012-04-06: 10 mg via INTRAVENOUS
  Filled 2012-04-06: qty 4
  Filled 2012-04-06: qty 8

## 2012-04-06 MED ORDER — PHENYLEPHRINE 40 MCG/ML (10ML) SYRINGE FOR IV PUSH (FOR BLOOD PRESSURE SUPPORT): 80.0000 ug | PREFILLED_SYRINGE | INTRAVENOUS | Status: DC | PRN

## 2012-04-06 MED ORDER — OXYTOCIN 40 UNITS IN LACTATED RINGERS INFUSION - SIMPLE MED
1.0000 m[IU]/min | INTRAVENOUS | Status: DC
  Administered 2012-04-06: 2 m[IU]/min via INTRAVENOUS
  Filled 2012-04-06: qty 1000

## 2012-04-06 MED ORDER — FENTANYL 2.5 MCG/ML BUPIVACAINE 1/10 % EPIDURAL INFUSION (WH - ANES)
INTRAMUSCULAR | Status: DC | PRN
  Administered 2012-04-06: 14 mL/h via EPIDURAL

## 2012-04-06 MED ORDER — FENTANYL 2.5 MCG/ML BUPIVACAINE 1/10 % EPIDURAL INFUSION (WH - ANES)
14.0000 mL/h | INTRAMUSCULAR | Status: DC
  Administered 2012-04-07: 14 mL/h via EPIDURAL
  Filled 2012-04-06 (×2): qty 125

## 2012-04-06 MED ORDER — OXYTOCIN 40 UNITS IN LACTATED RINGERS INFUSION - SIMPLE MED: 62.5000 mL/h | INTRAVENOUS | Status: DC

## 2012-04-06 MED ORDER — EPHEDRINE 5 MG/ML INJ: 10.0000 mg | INTRAVENOUS | Status: DC | PRN

## 2012-04-06 MED ORDER — OXYTOCIN BOLUS FROM INFUSION: 500.0000 mL | INTRAVENOUS | Status: DC

## 2012-04-06 MED ORDER — BUTORPHANOL TARTRATE 1 MG/ML IJ SOLN
1.0000 mg | INTRAMUSCULAR | Status: DC | PRN
  Administered 2012-04-06: 1 mg via INTRAVENOUS

## 2012-04-06 MED ORDER — BUTORPHANOL TARTRATE 1 MG/ML IJ SOLN: 2.0000 mg | Freq: Once | INTRAMUSCULAR | Status: DC

## 2012-04-06 MED ORDER — CITRIC ACID-SODIUM CITRATE 334-500 MG/5ML PO SOLN
30.0000 mL | ORAL | Status: DC | PRN
  Administered 2012-04-07: 30 mL via ORAL
  Filled 2012-04-06: qty 15

## 2012-04-06 MED ORDER — LIDOCAINE HCL (PF) 1 % IJ SOLN
INTRAMUSCULAR | Status: DC | PRN
  Administered 2012-04-06 (×2): 10 mL

## 2012-04-06 MED ORDER — LACTATED RINGERS IV SOLN
500.0000 mL | INTRAVENOUS | Status: DC | PRN
  Administered 2012-04-07 (×2): 500 mL via INTRAVENOUS

## 2012-04-06 MED ORDER — TERBUTALINE SULFATE 1 MG/ML IJ SOLN
0.2500 mg | Freq: Once | INTRAMUSCULAR | Status: AC | PRN
  Administered 2012-04-07: 0.25 mg via SUBCUTANEOUS
  Filled 2012-04-06: qty 1

## 2012-04-06 MED ORDER — IBUPROFEN 600 MG PO TABS: 600.0000 mg | ORAL_TABLET | Freq: Four times a day (QID) | ORAL | Status: DC | PRN

## 2012-04-06 MED ORDER — OXYCODONE-ACETAMINOPHEN 5-325 MG PO TABS: 1.0000 | ORAL_TABLET | ORAL | Status: DC | PRN

## 2012-04-06 MED ORDER — BUTORPHANOL TARTRATE 1 MG/ML IJ SOLN
INTRAMUSCULAR | Status: AC
  Administered 2012-04-06: 1 mg via INTRAVENOUS
  Filled 2012-04-06: qty 1

## 2012-04-06 MED ORDER — FLEET ENEMA 7-19 GM/118ML RE ENEM: 1.0000 | ENEMA | RECTAL | Status: DC | PRN

## 2012-04-06 MED ORDER — PHENYLEPHRINE 40 MCG/ML (10ML) SYRINGE FOR IV PUSH (FOR BLOOD PRESSURE SUPPORT)
80.0000 ug | PREFILLED_SYRINGE | INTRAVENOUS | Status: DC | PRN
  Filled 2012-04-06: qty 5

## 2012-04-06 MED ORDER — LACTATED RINGERS IV SOLN
INTRAVENOUS | Status: DC
  Administered 2012-04-06 – 2012-04-07 (×2): via INTRAVENOUS

## 2012-04-06 MED ORDER — PROMETHAZINE HCL 25 MG/ML IJ SOLN
12.5000 mg | INTRAMUSCULAR | Status: DC | PRN
  Administered 2012-04-06: 12.5 mg via INTRAVENOUS
  Filled 2012-04-06: qty 1

## 2012-04-06 MED ORDER — DIPHENHYDRAMINE HCL 50 MG/ML IJ SOLN: 12.5000 mg | INTRAMUSCULAR | Status: DC | PRN

## 2012-04-06 MED ORDER — LACTATED RINGERS IV SOLN
500.0000 mL | Freq: Once | INTRAVENOUS | Status: AC
  Administered 2012-04-06: 500 mL via INTRAVENOUS

## 2012-04-06 MED ORDER — ONDANSETRON HCL 4 MG/2ML IJ SOLN: 4.0000 mg | Freq: Four times a day (QID) | INTRAMUSCULAR | Status: DC | PRN

## 2012-04-06 NOTE — MAU Provider Note (Signed)
History   31 yo G1P0 at 76 3/7 weeks presented after calling office with contractions of gradually increasing frequency and intensity since this am, now reported q 3-5 min.  Had upset stomach last night, with diarrhea this am, now resolved.  No vomiting, dysuria, bleeding, or leaking of fluid.  Cervix was closed, 50%, vtx -1, low in pelvis on exam yesterday.  Patient Active Problem List  Diagnosis  . History of substance abuse  . Migraine headache  . Attention deficit disorder  . Anxiety and depression  . Yeast infection  . Single episode of gestational hypertension  Patent and husband moving to [place] very soon after delivery.   Chief Complaint  Patient presents with  . Labor Eval     OB History    Grav Para Term Preterm Abortions TAB SAB Ect Mult Living   1 0 0 0 0 0 0 0 0 0       Past Medical History  Diagnosis Date  . IBS (irritable bowel syndrome)   . Anxiety     KNONAPIN DR Lenord Fellers  . Migraine     IMITRX  . Asthma     CHILDHOOD EXERCISE INDUCED  . Mental disorder 2010    ADHD  . Depression AGE 38    WELBUTRIN  . Infection     UTI HX FREQUENT  . Infection     YEAST AND BV OCC  . Infection     CHLAMYDIA  . Irregular menses     Past Surgical History  Procedure Date  . Breast surgery 12/1999    augmentation   . Wisdom tooth extraction 2006  . 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 Intestine surgery     Family History  Problem Relation Age of Onset  . Hypertension Maternal Uncle   . Hypertension Maternal Grandmother   . Heart disease Maternal Grandfather   . Hypertension Maternal Grandfather   . Other Maternal Grandfather     HEPATITIS B  . Hypertension Mother   . Alcohol abuse Mother   . Other Father     EPILEPSY  . Other Paternal Grandmother     EPILEPSY  . Hypertension Paternal Grandfather   . Other Paternal Uncle     EPILEPSY  . Other Cousin     EPILEPSY  . Rheum arthritis Cousin     History  Substance Use Topics  . Smoking  status: Former Smoker -- 0.3 packs/day for 15 years    Types: Cigarettes    Quit date: 08/15/2011  . Smokeless tobacco: Never Used  . Alcohol Use: Yes     social    Allergies: No Known Allergies  Prescriptions prior to admission  Medication Sig Dispense Refill  . acetaminophen (TYLENOL) 325 MG tablet Take 650 mg by mouth every 6 (six) hours as needed. pain      . calcium carbonate (TUMS - DOSED IN MG ELEMENTAL CALCIUM) 500 MG chewable tablet Chew 1 tablet by mouth daily. heartburn      . docusate sodium (COLACE) 100 MG capsule Take 100 mg by mouth 2 (two) times daily.      Marland Kitchen OVER THE COUNTER MEDICATION       . Prenatal Vit-Fe Fumarate-FA (PRENATAL MULTIVITAMIN) TABS Take 1 tablet by mouth daily. OTC         Physical Exam   Last menstrual period 06/21/2011.  Chest clear Heart RRR without murmur Abd gravid, NT Pelvic--cervix very posterior, FT, 60-70%, vtx -1/0.  FHR reactive in initial  5 minutes UCs q 8 minutes at present.  ED Course  IUP at 37 3/7 weeks ? Early vs prodromal labor  Plan: Ambulate x 1 hour after reactive NST, then re-evaluate.    Nigel Bridgeman CNM, [place] 04/06/2012 2:42 PM

## 2012-04-06 NOTE — Telephone Encounter (Signed)
Spoke to pt as urgent call came in. Pt c/o ctx about every 5-6 mins since about ten thirty this am. No bleeding, no LOF, good FM. I told pt that for the time being she should observe and report back in 1 1/2 hrs- 2 hrs to let me know the status. If anything changes in the meantime such as bleeding, LOF or decreased FM, she should call before that time. She is agreeable. Pt called back  1 1/2 hrs later and ctx had increased in frequency and duration. Pt is in obvious discomfort and having to breathe through ctx, which are now 3 1/2 -5 mins apart. Spoke to McKee on call and she states to tell pt to report to MAU. Pt agrees and will go now. Melody Comas A

## 2012-04-06 NOTE — Progress Notes (Signed)
  Subjective: Comfortable with epidural, but still appears anxious, with frequent jerky movements of body.  Reports she feels less anxious now than before--feels that she can nap.  Objective: BP 126/67  Pulse 62  Temp 98.4 F (36.9 C) (Axillary)  Resp 20  SpO2 100%  LMP 06/21/2011  Filed Vitals:   04/06/12 2020 04/06/12 2021 04/06/12 2032 04/06/12 2102  BP:  111/94 110/54 126/67  Pulse: 69 79 69 62  Temp:      TempSrc:      Resp:  20 20 20   SpO2:  100%        Total I/O In: -  Out: 500 [Urine:500]  FHT:  Category 1 UC:   regular, every 4-6 minutes SVE:   4-5 cm, 80%, vtx, -1, BBOW AROM--light MSF Foley catheter placed  Assessment / Plan: Progressive labor Will CTO. Offered med for anxiety, but patient declines at present. Augment prn  Nigel Bridgeman 04/06/2012, 9:41 PM

## 2012-04-06 NOTE — H&P (Signed)
Mckenzie Gordon is a 31 y.o. female, G1P0 at 62 3/7 weeks presented for labor evaluation in MAU after calling the office with increasing UCs since this am.  Evaluated in MAU, with no cervical change, but contractions that remained strong and long. BP was also elevated in MAU, with systolics 140-150s, and diastolics > 100.  PIH labs were negative, and urine was negative for protein.    Consulted with Dr. Su Hilt, and the decision was made to admit for further observation, pain medication, 24 hour urine, and repeat PIH labs in am.  Very uncomfortable with contractions, with increase in strength of contractions after observation in MAU.  BP was elevated on initial exam on MAU--it is difficult to determine if elevated BP is only a result of pain response and anxiety, since it remains elevated even when patient is not contracting.  History of present pregnancy: Patient entered care at 14 weeks by LMP, but 10 6/7 weeks by [place] on that day.   EDC of 04/24/12 was established by [place] at 10 6/7 weeks.   Anatomy scan:  19 6/7 weeks, with normal findings and an posterior placenta.   Additional [place] evaluations:  None.   Significant prenatal events:  Elevated BP at 32 weeks, with report of persistent HA.  Had 24 hour urine of 108, with normal PIH labs.  No further issues with BP during pregnancy until today.  Seen yesterday in the office, normotensive. Last evaluation:  Cervix closed, very posterior, vtx low in pelvix, 50% on exam yesterday.  History OB History    Grav Para Term Preterm Abortions TAB SAB Ect Mult Living   1 0 0 0 0 0 0 0 0 0      Past Medical History  Diagnosis Date  . IBS (irritable bowel syndrome)   . Anxiety     KNONAPIN DR Lenord Fellers  . Migraine     IMITRX  . Asthma     CHILDHOOD EXERCISE INDUCED  . Mental disorder 2010    ADHD  . Depression AGE 34    WELBUTRIN  . Infection     UTI HX FREQUENT  . Infection     YEAST AND BV OCC  . Infection     CHLAMYDIA  . Irregular menses     Past Surgical History  Procedure Date  . Breast surgery 12/1999    augmentation   . Wisdom tooth extraction 2006  . 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 Intestine surgery    Family History: family history includes Alcohol abuse in her mother; Heart disease in her maternal grandfather; Hypertension in her maternal grandfather, maternal grandmother, maternal uncle, mother, and paternal grandfather; Other in her cousin, father, maternal grandfather, paternal grandmother, and paternal uncle; and Rheum arthritis in her cousin.  Social History:  reports that she quit smoking about 7 months ago. Her smoking use included Cigarettes. She has a 4.5 pack-year smoking history. She has never used smokeless tobacco. She reports that she drinks alcohol. She reports that she uses illicit drugs.  Both of these were before pregnancy.  Partner, [name], is present with the patient, involved and supportive.   Prenatal Transfer Tool  Maternal Diabetes: No Genetic Screening: Normal Maternal Ultrasounds/Referrals: Normal Fetal Ultrasounds or other Referrals:  None Maternal Substance Abuse:  No--not with pregnancy Significant Maternal Medications:  None Significant Maternal Lab Results:  Lab values include: Group B Strep negative Other Comments:  Gestational hypertension---admitted for observation, pain med, 24 hour urine  ROS:  Contractions, +FM  Dilation: Fingertip Effacement (%): 60 Station: 0 Exam by:: VEmilee Hero CNM Blood pressure 159/113, pulse 84, temperature 97.4 F (36.3 C), temperature source Oral, resp. rate 20, last menstrual period 06/21/2011.  Filed Vitals:   04/06/12 1627 04/06/12 1724 04/06/12 1725 04/06/12 1726  BP: 159/104 159/113 128/112 159/113  Pulse: 84 84 84 78  Temp:  97.4 F (36.3 C)    TempSrc:  Oral    Resp:  20      Exam Physical Exam  Chest clear Heart RRR without murmur Abd gravid, NT.  No epigastric or RUQ tenderness Pelvic--unchanged from  previous exam, with vtx 0 station and cervix very posterior. Ext--trace edema, DTR 3-4+, with 1-2 beats clonus bilaterally  FHR reactive, no decels UCs q 3-4 min, 90 sec duration  Results for orders placed during the hospital encounter of 04/06/12 (from the past 24 hour(s))  CBC     Status: Abnormal   Collection Time   04/06/12  3:38 PM      Component Value Range   WBC 11.9 (*) 4.0 - 10.5 K/uL   RBC 4.68  3.87 - 5.11 MIL/uL   Hemoglobin 12.9  12.0 - 15.0 g/dL   HCT 86.5  78.4 - 69.6 %   MCV 81.4  78.0 - 100.0 fL   MCH 27.6  26.0 - 34.0 pg   MCHC 33.9  30.0 - 36.0 g/dL   RDW 29.5  28.4 - 13.2 %   Platelets 324  150 - 400 K/uL  COMPREHENSIVE METABOLIC PANEL     Status: Abnormal   Collection Time   04/06/12  3:38 PM      Component Value Range   Sodium 133 (*) 135 - 145 mEq/L   Potassium 3.5  3.5 - 5.1 mEq/L   Chloride 99  96 - 112 mEq/L   CO2 21  19 - 32 mEq/L   Glucose, Bld 95  70 - 99 mg/dL   BUN 8  6 - 23 mg/dL   Creatinine, Ser 4.40  0.50 - 1.10 mg/dL   Calcium 9.3  8.4 - 10.2 mg/dL   Total Protein 6.4  6.0 - 8.3 g/dL   Albumin 2.8 (*) 3.5 - 5.2 g/dL   AST 18  0 - 37 U/L   ALT 17  0 - 35 U/L   Alkaline Phosphatase 181 (*) 39 - 117 U/L   Total Bilirubin 0.2 (*) 0.3 - 1.2 mg/dL   GFR calc non Af Amer >90  >90 mL/min   GFR calc Af Amer >90  >90 mL/min  LACTATE DEHYDROGENASE     Status: Normal   Collection Time   04/06/12  3:38 PM      Component Value Range   LDH 189  94 - 250 U/L  URIC ACID     Status: Normal   Collection Time   04/06/12  3:38 PM      Component Value Range   Uric Acid, Serum 4.9  2.4 - 7.0 mg/dL  URINALYSIS, ROUTINE W REFLEX MICROSCOPIC     Status: Abnormal   Collection Time   04/06/12  4:20 PM      Component Value Range   Color, Urine YELLOW  YELLOW   APPearance CLEAR  CLEAR   Specific Gravity, Urine 1.010  1.005 - 1.030   pH 7.5  5.0 - 8.0   Glucose, UA NEGATIVE  NEGATIVE mg/dL   Hgb urine dipstick NEGATIVE  NEGATIVE   Bilirubin Urine NEGATIVE   NEGATIVE  Ketones, ur 40 (*) NEGATIVE mg/dL   Protein, ur NEGATIVE  NEGATIVE mg/dL   Urobilinogen, UA 0.2  0.0 - 1.0 mg/dL   Nitrite NEGATIVE  NEGATIVE   Leukocytes, UA SMALL (*) NEGATIVE  URINE MICROSCOPIC-ADD ON     Status: Abnormal   Collection Time   04/06/12  4:20 PM      Component Value Range   Squamous Epithelial / LPF FEW (*) RARE   WBC, UA 0-2  <3 WBC/hpf   Bacteria, UA FEW (*) RARE   Crystals CA OXALATE CRYSTALS (*) NEGATIVE   Urine-Other MICROSCOPIC EXAM PERFORMED ON UNCONCENTRATED URINE       Prenatal labs: ABO, Rh: O/POS/-- (04/24 1008) Antibody: NEG (04/24 1008) Rubella: 113.4 (04/24 1008) RPR: NON REAC (08/30 1019)  HBsAg: NEGATIVE (04/24 1008)  HIV: NON REACTIVE (04/24 1008)  GBS: NEGATIVE (10/25 1039)  24 hour urine at 32 weeks = 108 PIH labs WNL at 32 weeks Hgb 13.6 at NOB, 11.2 at 28 weeks 1st trimester screen and AFP WNL  Assessment/Plan: IUP at 37 3/[redacted] weeks Gestational hypertension Early vs. Prodromal labor GBS negative  Plan: Admit to Birthing Suite per consult with Dr. Su Hilt Close observation of BP 24 hour urine--started at 5pm today IV pain medication Repeat PIH labs in the am Labetalol IV prn for persistently elevated BP Reviewed plan of care with patient and partner--they understand we are in an evaluation mode, in light of her gestational age and lack of cervical change at present. Observe labor status at present.  Nigel Bridgeman 04/06/2012, 5:40 PM

## 2012-04-06 NOTE — Anesthesia Preprocedure Evaluation (Signed)
Anesthesia Evaluation  Patient identified by MRN, date of birth, ID band Patient awake    Reviewed: Allergy & Precautions, H&P , NPO status , Patient's Chart, lab work & pertinent test results  Airway Mallampati: II TM Distance: >3 FB Neck ROM: full    Dental No notable dental hx.    Pulmonary    Pulmonary exam normal       Cardiovascular negative cardio ROS      Neuro/Psych    GI/Hepatic negative GI ROS, Neg liver ROS,   Endo/Other  Morbid obesity  Renal/GU negative Renal ROS  negative genitourinary   Musculoskeletal negative musculoskeletal ROS (+)   Abdominal (+) + obese,   Peds  Hematology negative hematology ROS (+)   Anesthesia Other Findings   Reproductive/Obstetrics (+) Pregnancy                           Anesthesia Physical Anesthesia Plan  ASA: III  Anesthesia Plan: Epidural   Post-op Pain Management:    Induction:   Airway Management Planned:   Additional Equipment:   Intra-op Plan:   Post-operative Plan:   Informed Consent: I have reviewed the patients History and Physical, chart, labs and discussed the procedure including the risks, benefits and alternatives for the proposed anesthesia with the patient or authorized representative who has indicated his/her understanding and acceptance.     Plan Discussed with:   Anesthesia Plan Comments:         Anesthesia Quick Evaluation

## 2012-04-06 NOTE — Progress Notes (Signed)
  Subjective: Received 1 mg Stadol at 6:35,p, with some benefit, but patient still very uncomfortable with contractions.    Objective: BPs 140s-160s/90s-112 Other VSS   Required dose of IV Labetalol at 6;52pm  FHT: Series of variable decels with contractions--resolved spontaneously to reassuring tracing. UC:   regular, every 3 minutes SVE:   Dilation: 3 Effacement (%): 90 Station: 0 Exam by:: Ivyana Locey, CNM  Cervix very anterior now.  Assessment / Plan: Definitive early labor Gestational hypertension Recommend epidural for comfort and assistance with BP   Mckenzie Gordon 04/06/2012, 8:31 PM

## 2012-04-06 NOTE — Anesthesia Procedure Notes (Signed)
Epidural Patient location during procedure: OB Start time: 04/06/2012 7:34 PM End time: 04/06/2012 7:38 PM  Staffing Anesthesiologist: Sandrea Hughs Performed by: anesthesiologist   Preanesthetic Checklist Completed: patient identified, site marked, surgical consent, pre-op evaluation, timeout performed, IV checked, risks and benefits discussed and monitors and equipment checked  Epidural Patient position: sitting Prep: site prepped and draped and DuraPrep Patient monitoring: continuous pulse ox and blood pressure Approach: midline Injection technique: LOR air  Needle:  Needle type: Tuohy  Needle gauge: 17 G Needle length: 9 cm and 9 Needle insertion depth: 8 cm Catheter type: closed end flexible Catheter size: 19 Gauge Catheter at skin depth: 14 cm Test dose: negative and Other  Assessment Sensory level: T9 Events: blood not aspirated, injection not painful, no injection resistance, negative IV test and no paresthesia  Additional Notes Reason for block:procedure for pain

## 2012-04-06 NOTE — MAU Note (Signed)
Pt C/O stomach upset, cramping during the night, felt more like uc's this a.m., denies bleeding or LOF.

## 2012-04-06 NOTE — Progress Notes (Signed)
Subjective: Comfortable.  No PIH s/s.  Reports able to sleep for about 2 hours recently.    Objective: BP 87/41  Pulse 63  Temp 97.6 F (36.4 C) (Oral)  Resp 20  SpO2 100%  LMP 06/21/2011   Total I/O In: -  Out: 500 [Urine:500]  FHT:  FHR: 135 bpm, variability: moderate,  accelerations:  Present,  decelerations:  Absent UC:   irregular, every 6 minutes SVE:   Dilation: 5 Effacement (%): 80 Station: -1 Exam by:: H Steeleman, cNM IUPC inserted w/o difficulty; MVU's inadequate Labs: Lab Results  Component Value Date   WBC 11.9* 04/06/2012   HGB 12.9 04/06/2012   HCT 38.1 04/06/2012   MCV 81.4 04/06/2012   PLT 324 04/06/2012    Assessment / Plan: 1. [redacted]w[redacted]d 2. No cervical change since AROM  3.normotensive  Labor: protracted active phase Preeclampsia:  intake and ouput balanced Fetal Wellbeing:  Category I Pain Control:  Epidural I/D:  n/a Anticipated MOD:  NSVD 1.  Rec'd pitocin for inadequate ctxs vs continued expectant management; r/b/a rev'd, and pt agreeable to proceed with pitocin augmentation. 2.  C/w MD prn Michiel Sivley H 04/06/2012, 11:31 PM

## 2012-04-06 NOTE — Progress Notes (Signed)
CNM informed of elevated BP's, is ordering PIH labs, pt may go walking after labs are drawn.

## 2012-04-07 ENCOUNTER — Encounter (HOSPITAL_COMMUNITY): Payer: Self-pay | Admitting: *Deleted

## 2012-04-07 ENCOUNTER — Encounter (HOSPITAL_COMMUNITY)
Admission: AD | Disposition: A | Discharge status: Home / Self Care | Payer: Self-pay | Source: Ambulatory Visit | Attending: Obstetrics and Gynecology

## 2012-04-07 ENCOUNTER — Encounter (HOSPITAL_COMMUNITY): Payer: Self-pay | Admitting: Anesthesiology

## 2012-04-07 ENCOUNTER — Inpatient Hospital Stay (HOSPITAL_COMMUNITY): Payer: Medicaid Other | Admitting: Anesthesiology

## 2012-04-07 DIAGNOSIS — Z98891 History of uterine scar from previous surgery: Secondary | ICD-10-CM

## 2012-04-07 DIAGNOSIS — O139 Gestational [pregnancy-induced] hypertension without significant proteinuria, unspecified trimester: Secondary | ICD-10-CM

## 2012-04-07 LAB — PROTEIN, URINE, 24 HOUR
Collection Interval-UPROT: 24 hours
Protein, 24H Urine: 1050 mg/d — ABNORMAL HIGH (ref 50–100)
Protein, Urine: 70 mg/dL

## 2012-04-07 LAB — CREATININE CLEARANCE, URINE, 24 HOUR
Collection Interval-CRCL: 24 hours
Creatinine Clearance: 150 mL/min — ABNORMAL HIGH (ref 75–115)
Urine Total Volume-CRCL: 1500 mL

## 2012-04-07 LAB — CREATININE, SERUM
GFR calc Af Amer: >90 mL/min (ref >90)
GFR calc non Af Amer: >90 mL/min (ref >90)

## 2012-04-07 SURGERY — Surgical Case
Anesthesia: Epidural | Site: Abdomen | Wound class: Clean Contaminated

## 2012-04-07 MED ORDER — ONDANSETRON HCL 4 MG/2ML IJ SOLN: 4.0000 mg | INTRAMUSCULAR | Status: DC | PRN

## 2012-04-07 MED ORDER — TETANUS-DIPHTH-ACELL PERTUSSIS 5-2.5-18.5 LF-MCG/0.5 IM SUSP
0.5000 mL | Freq: Once | INTRAMUSCULAR | Status: AC
  Administered 2012-04-08: 0.5 mL via INTRAMUSCULAR
  Filled 2012-04-07: qty 0.5

## 2012-04-07 MED ORDER — HYDROMORPHONE HCL PF 1 MG/ML IJ SOLN: 0.2500 mg | INTRAMUSCULAR | Status: DC | PRN

## 2012-04-07 MED ORDER — OXYTOCIN 40 UNITS IN LACTATED RINGERS INFUSION - SIMPLE MED: 62.5000 mL/h | INTRAVENOUS | Status: AC

## 2012-04-07 MED ORDER — ONDANSETRON HCL 4 MG/2ML IJ SOLN: 4.0000 mg | Freq: Three times a day (TID) | INTRAMUSCULAR | Status: DC | PRN

## 2012-04-07 MED ORDER — DIPHENHYDRAMINE HCL 25 MG PO CAPS: 25.0000 mg | ORAL_CAPSULE | ORAL | Status: DC | PRN

## 2012-04-07 MED ORDER — SCOPOLAMINE 1 MG/3DAYS TD PT72
MEDICATED_PATCH | TRANSDERMAL | Status: AC
  Administered 2012-04-07: 1.5 mg via TRANSDERMAL
  Filled 2012-04-07: qty 1

## 2012-04-07 MED ORDER — LACTATED RINGERS IV SOLN
INTRAVENOUS | Status: DC
  Administered 2012-04-07 – 2012-04-08 (×4): via INTRAVENOUS

## 2012-04-07 MED ORDER — SCOPOLAMINE 1 MG/3DAYS TD PT72
1.0000 | MEDICATED_PATCH | Freq: Once | TRANSDERMAL | Status: DC
  Administered 2012-04-07: 1.5 mg via TRANSDERMAL

## 2012-04-07 MED ORDER — LACTATED RINGERS IV SOLN
INTRAVENOUS | Status: DC | PRN
  Administered 2012-04-07: 06:00:00 via INTRAVENOUS

## 2012-04-07 MED ORDER — DIBUCAINE 1 % RE OINT: 1.0000 "application " | TOPICAL_OINTMENT | RECTAL | Status: DC | PRN

## 2012-04-07 MED ORDER — ZOLPIDEM TARTRATE 5 MG PO TABS: 5.0000 mg | ORAL_TABLET | Freq: Every evening | ORAL | Status: DC | PRN

## 2012-04-07 MED ORDER — SODIUM CHLORIDE 0.9 % IJ SOLN: 3.0000 mL | INTRAMUSCULAR | Status: DC | PRN

## 2012-04-07 MED ORDER — NALBUPHINE HCL 10 MG/ML IJ SOLN
5.0000 mg | INTRAMUSCULAR | Status: DC | PRN
  Filled 2012-04-07: qty 1

## 2012-04-07 MED ORDER — LANOLIN HYDROUS EX OINT: 1.0000 "application " | TOPICAL_OINTMENT | CUTANEOUS | Status: DC | PRN

## 2012-04-07 MED ORDER — MAGNESIUM SULFATE 40 MG/ML IJ SOLN: 4.0000 g | Freq: Once | INTRAMUSCULAR | Status: DC

## 2012-04-07 MED ORDER — MAGNESIUM SULFATE 40 G IN LACTATED RINGERS - SIMPLE
2.0000 g/h | Freq: Once | INTRAVENOUS | Status: DC
  Filled 2012-04-07: qty 500

## 2012-04-07 MED ORDER — DIPHENHYDRAMINE HCL 25 MG PO CAPS
25.0000 mg | ORAL_CAPSULE | Freq: Four times a day (QID) | ORAL | Status: DC | PRN
  Administered 2012-04-07: 25 mg via ORAL
  Filled 2012-04-07: qty 1

## 2012-04-07 MED ORDER — MEPERIDINE HCL 25 MG/ML IJ SOLN: 6.2500 mg | INTRAMUSCULAR | Status: DC | PRN

## 2012-04-07 MED ORDER — LACTATED RINGERS IV BOLUS (SEPSIS): 300.0000 mL | Freq: Once | INTRAVENOUS | Status: DC

## 2012-04-07 MED ORDER — OXYTOCIN 10 UNIT/ML IJ SOLN
40.0000 [IU] | INTRAVENOUS | Status: DC | PRN
  Administered 2012-04-07: 40 [IU] via INTRAVENOUS

## 2012-04-07 MED ORDER — MORPHINE SULFATE (PF) 0.5 MG/ML IJ SOLN
INTRAMUSCULAR | Status: DC | PRN
  Administered 2012-04-07: 4 mg via EPIDURAL

## 2012-04-07 MED ORDER — DIPHENHYDRAMINE HCL 50 MG/ML IJ SOLN: 12.5000 mg | INTRAMUSCULAR | Status: DC | PRN

## 2012-04-07 MED ORDER — SODIUM CHLORIDE 0.9 % IV SOLN
1.0000 ug/kg/h | INTRAVENOUS | Status: DC | PRN
  Filled 2012-04-07: qty 2.5

## 2012-04-07 MED ORDER — 0.9 % SODIUM CHLORIDE (POUR BTL) OPTIME
TOPICAL | Status: DC | PRN
  Administered 2012-04-07: 1000 mL

## 2012-04-07 MED ORDER — SODIUM BICARBONATE 8.4 % IV SOLN
INTRAVENOUS | Status: AC
  Filled 2012-04-07: qty 50

## 2012-04-07 MED ORDER — IBUPROFEN 600 MG PO TABS
600.0000 mg | ORAL_TABLET | Freq: Four times a day (QID) | ORAL | Status: DC
  Administered 2012-04-07 – 2012-04-10 (×11): 600 mg via ORAL
  Filled 2012-04-07 (×12): qty 1

## 2012-04-07 MED ORDER — LACTATED RINGERS IV SOLN
INTRAVENOUS | Status: DC | PRN
  Administered 2012-04-07 (×2): via INTRAVENOUS

## 2012-04-07 MED ORDER — LACTATED RINGERS IV SOLN
INTRAVENOUS | Status: DC
  Administered 2012-04-07: 02:00:00 via INTRAUTERINE

## 2012-04-07 MED ORDER — KETOROLAC TROMETHAMINE 60 MG/2ML IM SOLN
INTRAMUSCULAR | Status: AC
  Administered 2012-04-07: 60 mg via INTRAMUSCULAR
  Filled 2012-04-07: qty 2

## 2012-04-07 MED ORDER — OXYCODONE-ACETAMINOPHEN 5-325 MG PO TABS
1.0000 | ORAL_TABLET | ORAL | Status: DC | PRN
  Administered 2012-04-07: 1 via ORAL
  Administered 2012-04-08 – 2012-04-10 (×8): 2 via ORAL
  Filled 2012-04-07 (×7): qty 2
  Filled 2012-04-07: qty 1
  Filled 2012-04-07: qty 2

## 2012-04-07 MED ORDER — MIDAZOLAM HCL 2 MG/2ML IJ SOLN
INTRAMUSCULAR | Status: AC
  Filled 2012-04-07: qty 2

## 2012-04-07 MED ORDER — MORPHINE SULFATE (PF) 0.5 MG/ML IJ SOLN
INTRAMUSCULAR | Status: DC | PRN
  Administered 2012-04-07: 1 mg via INTRAVENOUS

## 2012-04-07 MED ORDER — MAGNESIUM SULFATE 40 G IN LACTATED RINGERS - SIMPLE
2.0000 g/h | INTRAVENOUS | Status: AC
  Administered 2012-04-08: 2 g/h via INTRAVENOUS
  Filled 2012-04-07 (×2): qty 500

## 2012-04-07 MED ORDER — NALOXONE HCL 0.4 MG/ML IJ SOLN: 0.4000 mg | INTRAMUSCULAR | Status: DC | PRN

## 2012-04-07 MED ORDER — ONDANSETRON HCL 4 MG/2ML IJ SOLN
INTRAMUSCULAR | Status: AC
  Filled 2012-04-07: qty 2

## 2012-04-07 MED ORDER — ONDANSETRON HCL 4 MG/2ML IJ SOLN
INTRAMUSCULAR | Status: DC | PRN
  Administered 2012-04-07: 4 mg via INTRAVENOUS

## 2012-04-07 MED ORDER — PROMETHAZINE HCL 25 MG/ML IJ SOLN: 6.2500 mg | INTRAMUSCULAR | Status: DC | PRN

## 2012-04-07 MED ORDER — MENTHOL 3 MG MT LOZG
1.0000 | LOZENGE | OROMUCOSAL | Status: DC | PRN
  Administered 2012-04-08: 3 mg via ORAL
  Filled 2012-04-07: qty 9

## 2012-04-07 MED ORDER — KETOROLAC TROMETHAMINE 30 MG/ML IJ SOLN: 15.0000 mg | Freq: Once | INTRAMUSCULAR | Status: DC | PRN

## 2012-04-07 MED ORDER — WITCH HAZEL-GLYCERIN EX PADS: 1.0000 "application " | MEDICATED_PAD | CUTANEOUS | Status: DC | PRN

## 2012-04-07 MED ORDER — OXYTOCIN 10 UNIT/ML IJ SOLN
INTRAMUSCULAR | Status: AC
  Filled 2012-04-07: qty 4

## 2012-04-07 MED ORDER — MORPHINE SULFATE 0.5 MG/ML IJ SOLN
INTRAMUSCULAR | Status: AC
  Filled 2012-04-07: qty 10

## 2012-04-07 MED ORDER — SIMETHICONE 80 MG PO CHEW
80.0000 mg | CHEWABLE_TABLET | Freq: Three times a day (TID) | ORAL | Status: DC
  Administered 2012-04-07 – 2012-04-10 (×11): 80 mg via ORAL

## 2012-04-07 MED ORDER — KETOROLAC TROMETHAMINE 30 MG/ML IJ SOLN
30.0000 mg | Freq: Four times a day (QID) | INTRAMUSCULAR | Status: AC | PRN
  Filled 2012-04-07: qty 1

## 2012-04-07 MED ORDER — PRENATAL MULTIVITAMIN CH
1.0000 | ORAL_TABLET | Freq: Every day | ORAL | Status: DC
  Administered 2012-04-08 – 2012-04-10 (×3): 1 via ORAL
  Filled 2012-04-07 (×3): qty 1

## 2012-04-07 MED ORDER — CEFAZOLIN SODIUM-DEXTROSE 2-3 GM-% IV SOLR
INTRAVENOUS | Status: AC
  Filled 2012-04-07: qty 50

## 2012-04-07 MED ORDER — LIDOCAINE-EPINEPHRINE (PF) 2 %-1:200000 IJ SOLN
INTRAMUSCULAR | Status: AC
  Filled 2012-04-07: qty 20

## 2012-04-07 MED ORDER — SIMETHICONE 80 MG PO CHEW: 80.0000 mg | CHEWABLE_TABLET | ORAL | Status: DC | PRN

## 2012-04-07 MED ORDER — MIDAZOLAM HCL 5 MG/5ML IJ SOLN
INTRAMUSCULAR | Status: DC | PRN
  Administered 2012-04-07: 1 mg via INTRAVENOUS
  Administered 2012-04-07 (×2): 0.5 mg via INTRAVENOUS
  Administered 2012-04-07 (×2): 1 mg via INTRAVENOUS

## 2012-04-07 MED ORDER — DIPHENHYDRAMINE HCL 50 MG/ML IJ SOLN
25.0000 mg | INTRAMUSCULAR | Status: DC | PRN
Start: 2012-04-07 — End: 2012-04-10

## 2012-04-07 MED ORDER — METOCLOPRAMIDE HCL 5 MG/ML IJ SOLN: 10.0000 mg | Freq: Three times a day (TID) | INTRAMUSCULAR | Status: DC | PRN

## 2012-04-07 MED ORDER — KETOROLAC TROMETHAMINE 60 MG/2ML IM SOLN
60.0000 mg | Freq: Once | INTRAMUSCULAR | Status: AC | PRN
  Administered 2012-04-07: 60 mg via INTRAMUSCULAR

## 2012-04-07 MED ORDER — MAGNESIUM SULFATE BOLUS VIA INFUSION
4.0000 g | Freq: Once | INTRAVENOUS | Status: AC
  Administered 2012-04-08: 4 g via INTRAVENOUS
  Filled 2012-04-07: qty 500

## 2012-04-07 MED ORDER — ONDANSETRON HCL 4 MG PO TABS: 4.0000 mg | ORAL_TABLET | ORAL | Status: DC | PRN

## 2012-04-07 MED ORDER — SENNOSIDES-DOCUSATE SODIUM 8.6-50 MG PO TABS
2.0000 | ORAL_TABLET | Freq: Every day | ORAL | Status: DC
  Administered 2012-04-07 – 2012-04-09 (×3): 2 via ORAL

## 2012-04-07 MED ORDER — SODIUM BICARBONATE 8.4 % IV SOLN
INTRAVENOUS | Status: DC | PRN
  Administered 2012-04-07: 5 mL via EPIDURAL

## 2012-04-07 MED ORDER — KETOROLAC TROMETHAMINE 30 MG/ML IJ SOLN
30.0000 mg | Freq: Four times a day (QID) | INTRAMUSCULAR | Status: AC | PRN
  Administered 2012-04-07: 30 mg via INTRAVENOUS

## 2012-04-07 SURGICAL SUPPLY — 37 items
BENZOIN TINCTURE PRP APPL 2/3 (GAUZE/BANDAGES/DRESSINGS) ×2 IMPLANT
CLOTH BEACON ORANGE TIMEOUT ST (SAFETY) ×2 IMPLANT
CONTAINER PREFILL 10% NBF 15ML (MISCELLANEOUS) IMPLANT
DRAPE SURG 17X23 STRL (DRAPES) IMPLANT
DRSG COVADERM 4X10 (GAUZE/BANDAGES/DRESSINGS) ×4 IMPLANT
DURAPREP 26ML APPLICATOR (WOUND CARE) IMPLANT
ELECT REM PT RETURN 9FT ADLT (ELECTROSURGICAL) ×2
ELECTRODE REM PT RTRN 9FT ADLT (ELECTROSURGICAL) ×1 IMPLANT
EXTRACTOR VACUUM M CUP 4 TUBE (SUCTIONS) IMPLANT
GLOVE BIO SURGEON STRL SZ7.5 (GLOVE) ×4 IMPLANT
GLOVE BIOGEL PI IND STRL 7.5 (GLOVE) ×1 IMPLANT
GLOVE BIOGEL PI INDICATOR 7.5 (GLOVE) ×1
GOWN PREVENTION PLUS LG XLONG (DISPOSABLE) ×6 IMPLANT
HEMOSTAT SURGICEL 2X3 (HEMOSTASIS) ×2 IMPLANT
KIT ABG SYR 3ML LUER SLIP (SYRINGE) ×2 IMPLANT
NEEDLE HYPO 22GX1.5 SAFETY (NEEDLE) IMPLANT
NEEDLE HYPO 25X5/8 SAFETYGLIDE (NEEDLE) ×2 IMPLANT
NS IRRIG 1000ML POUR BTL (IV SOLUTION) ×4 IMPLANT
PACK C SECTION WH (CUSTOM PROCEDURE TRAY) ×2 IMPLANT
PAD OB MATERNITY 4.3X12.25 (PERSONAL CARE ITEMS) ×2 IMPLANT
RETRACTOR WND ALEXIS 25 LRG (MISCELLANEOUS) ×1 IMPLANT
RTRCTR WOUND ALEXIS 25CM LRG (MISCELLANEOUS) ×2
SLEEVE SCD COMPRESS KNEE MED (MISCELLANEOUS) ×2 IMPLANT
STRIP CLOSURE SKIN 1/2X4 (GAUZE/BANDAGES/DRESSINGS) ×2 IMPLANT
SUT CHROMIC 2 0 CT 1 (SUTURE) ×2 IMPLANT
SUT MNCRL AB 3-0 PS2 27 (SUTURE) ×2 IMPLANT
SUT PLAIN 0 NONE (SUTURE) IMPLANT
SUT PLAIN 2 0 XLH (SUTURE) ×2 IMPLANT
SUT VIC AB 0 CT1 36 (SUTURE) ×2 IMPLANT
SUT VIC AB 0 CTX 36 (SUTURE) ×5
SUT VIC AB 0 CTX36XBRD ANBCTRL (SUTURE) ×5 IMPLANT
SUT VIC AB 3-0 SH 27 (SUTURE) ×1
SUT VIC AB 3-0 SH 27X BRD (SUTURE) ×1 IMPLANT
SYR CONTROL 10ML LL (SYRINGE) IMPLANT
TOWEL OR 17X24 6PK STRL BLUE (TOWEL DISPOSABLE) ×4 IMPLANT
TRAY FOLEY CATH 14FR (SET/KITS/TRAYS/PACK) IMPLANT
WATER STERILE IRR 1000ML POUR (IV SOLUTION) IMPLANT

## 2012-04-07 NOTE — Op Note (Signed)
Cesarean Section Procedure Note  Indications: Term in labor with fetal intolerance of labor  Pre-operative Diagnosis: Fetal Intolerance to Labor   Post-operative Diagnosis: Fetal Intolerance to Labor  Procedure: CESAREAN SECTION  Surgeon: Purcell Nails, MD    Assistants: Denny Levy, CNM  Anesthesia: Epidural  Anesthesiologist: Sandrea Hughs., MD   Procedure Details  The patient was taken to the operating room secondary to fetal intolerance of labor after the risks, benefits, complications, treatment options, and expected outcomes were discussed with the patient.  The patient concurred with the proposed plan, giving informed consent which was signed and witnessed. The patient was taken to Operating Room 1, identified as Mckenzie Gordon and the procedure verified as C-Section Delivery. A Time Out was held and the above information confirmed.  After induction of anesthesia by obtaining a spinal, the patient was prepped and draped in the usual sterile manner. A Pfannenstiel skin incision was made and carried down through the subcutaneous tissue to the underlying layer of fascia.  The fascia was incised bilaterally and extended transversely bilaterally with the Mayo scissors. Kocher clamps were placed on the inferior aspect of the fascial incision and the underlying rectus muscle was separated from the fascia. The same was done on the superior aspect of the fascial incision.  The peritoneum was identified, entered bluntly and extended manually. The Alexis retractor was placed.  The utero-vesical peritoneal reflection was incised transversely and the bladder flap was bluntly freed from the lower uterine segment. A low transverse uterine incision was made with the scalpel and extended bilaterally with the bandage scissors.  The infant was delivered in vertex position without difficulty in DOP position.  After the umbilical cord was clamped and cut, the infant was handed to  the awaiting pediatricians.  Cord blood was obtained for evaluation.  The placenta was removed intact and appeared to be within normal limits. The uterus was cleared of all clots and debris. The uterine incision was closed with running interlocking sutures of 0 Vicryl and a second imbricating layer was performed as well.  Bilateral uterine extensions at the angles were noted and repaired in a similar fashion.   Bilateral tubes and ovaries appeared to be within normal limits.  Good hemostasis was noted but to assure hemostasis at the right angle which initially had oozing after placing stitches, surgicel was placed.  Copious irrigation was performed until clear.  The peritoneum was repaired with 2-0 chromic via a running suture.  The fascia was reapproximated with a running suture of 0 Vicryl. The subcutaneous tissue was reapproximated with 3 interrupted sutures of 2-0 plain.  The skin was reapproximated with a subcuticular suture of 3-0 monocryl.  Steristrips were applied.  Instrument, sponge, and needle counts were correct prior to abdominal closure and at the conclusion of the case.  The patient was awaiting transfer to the recovery room in good condition.  Findings: Live female infant with Apgars 8 at one minute and 9 at five minutes.  Normal appearing bilateral ovaries and fallopian tubes were noted.  Estimated Blood Loss:  750 ml         Drains: foley to gravity 100 cc blood tinged clearing in tubing by the end of the case         Total IV Fluids: 2200 ml         Specimens to Pathology: Placenta         Complications:  None; patient tolerated the procedure well.  Disposition: PACU - hemodynamically stable.         Condition: stable  Attending Attestation: I performed the procedure.

## 2012-04-07 NOTE — Transfer of Care (Signed)
Immediate Anesthesia Transfer of Care Note  Patient: Mckenzie Gordon  Procedure(s) Performed: Procedure(s) (LRB) with comments: CESAREAN SECTION (N/A) - Primary Cesarean Section Delivery Boy @ 0530, Apgars 8/9   Patient Location: PACU  Anesthesia Type:Epidural  Level of Consciousness: awake, alert  and oriented  Airway & Oxygen Therapy: Patient Spontanous Breathing  Post-op Assessment: Report given to PACU RN and Post -op Vital signs reviewed and stable  Post vital signs: Reviewed and stable  Complications: No apparent anesthesia complications

## 2012-04-07 NOTE — Anesthesia Postprocedure Evaluation (Signed)
Anesthesia Post Note  Patient: Mckenzie Gordon  Procedure(s) Performed: Procedure(s) (LRB): CESAREAN SECTION (N/A)  Anesthesia type: Epidural  Patient location: PACU  Post pain: Pain level controlled  Post assessment: Post-op Vital signs reviewed  Last Vitals:  Filed Vitals:   04/07/12 0650  BP: 167/98  Pulse: 92  Temp: 36.6 C  Resp: 18    Post vital signs: Reviewed  Level of consciousness: awake  Complications: No apparent anesthesia complications

## 2012-04-07 NOTE — Consult Note (Signed)
Neonatology Note:  Attendance at C-section:  I was asked to attend this Stat C/S at term due to a prolonged FHR of 70 lasting 8-10 minutes. The mother is a G1P0 O pos, GBS neg with anxiety, panic attacks, ADHD, depression, IBS, migraines, frequent UTI, and gestational HTN. She is on Knonapin and Welbutrin. ROM 8 hours prior to delivery, fluid with thin meconium. Infant delivered OP and had good spontaneous cry and tone. Needed only minimal bulb suctioning. Ap 8/9. Lungs clear to ausc in DR. To CN to care of Pediatrician. Of note, the baby's father had pyloric stenosis in infancy.  Chelby Salata, MD  

## 2012-04-07 NOTE — OR Nursing (Signed)
Surgicel 2x3 placed on Uterus by Dr. Su Hilt Lot # [id number] 2018-01.  Approximately blood expressed on uterine massage.  Uterus firm three fingers below umbilicus.

## 2012-04-07 NOTE — Progress Notes (Signed)
Patient ID: Mckenzie Gordon, female   DOB: 28-Feb-1981, 31 y.o.   MRN: [id number]  Narrative note (late entry from 2)  Called to bedside by RN at [phone] for presumptive prolonged decel, but RN having difficulty tracing continuous FHR w/ external monitor.  At 0346, SVE and cx 10/100/+1 to +2, and FSE placed.  Just prior to 0335, FHR w/ baseline of 135 and moderate variability.  Ctxs were spontaneous, every 2-3 minutes.  Pt was feeling urge to push on my arrival to room.  Oxygen mask was already on, and RN's changed pt's position to encourage intrauterine resuscitation, along w/ IVF bolus.  FHR recovered to 150 baseline w/ next contraction after FSE placed, and decels resolved after 3 more ctxs.  With FHR stable, cx complete, and pt feeling urge to push, trial of pushing attempted at 0357 (pushed w/ 3 ctxs total).   Moderate variables were noted w/ 2 of the pushes, and in light of probable extended time that would be needed for pt to achieve SVD, I recommended rest and descent.    The next ctx after pushing trial, FHR had very mild variable w/ ctx and baseline remained at 150 with minimal variability.  Suspecting fetal presentation to be OP or OT, recommended Rt exaggerated Sims position, and with 2nd ctx after pushing trial (at 0416), pt assisted to that position and FHR abruptly dropped to 60-70s and remained prolonged decel for 9 minutes before recovery to baseline in 150s.  Attending MD o/c, Dr. Su Hilt, called at [phone], to report prolonged decel and asked to come to bedside for eval.  While awaiting her arrival, Dr. Despina Hidden, o/c attending for Faculty practice, called to beside for eval of FHT and possible need for expedited delivery.  During prolonged decel, pt assisted to hands-n-knees position in consideration of possible occult cord, but FHR did not respond to position, and improvement was not noted until maternal position was returned to Semi-Fowler's.   By 0425, FHR had recovered to baseline of 150,  w/ minimal to moderate variability, but next 2 ctxs, still variable decels; by 0432, baseline stable between ctxs, and earlies noted w/ ctxs.  Dr. Su Hilt at bedside and assumed care of pt at 0448.    Mckenzie Gordon, CNM 04/07/12 [phone]

## 2012-04-07 NOTE — Progress Notes (Signed)
CSW aware of consult and spoke with RN.  Plan to consult with MOB in a.m. 11/3.  Full consult to follow.

## 2012-04-07 NOTE — Progress Notes (Addendum)
S: pt doing well, just now getting out of bed w assistance O: VSS Incision CDI Foley in place Lochia light Ext: neg homans, no edema or pain A: s/p C-section Fetal intolerance and FTD  P: continue care   24 hour urine completed at 5pm

## 2012-04-07 NOTE — Progress Notes (Signed)
Subjective: Called by RN to assess FHT just before 0200.  Pitocin on 3mu, and recurrent variables noted despite position changes and IVF bolus.  Pt feeling "burning" sensation from catheter.  No rectal pressure.   Objective: BP 114/16  Pulse 73  Temp 97.6 F (36.4 C) (Oral)  Resp 18  SpO2 100%  LMP 06/21/2011   Total I/O In: -  Out: 500 [Urine:500]  FHT:  FHR: 135 bpm, variability: moderate,  accelerations:  Present,  decelerations:  Present variables UC:   regular, every 2-3 minutes SVE:   Dilation: 8 Effacement (%): 80 Station: +1 Exam by:: Gordon Maleka Contino, CNM Pt received about of amnioinfusion, and d/c'd while at bedside, along w/ pitocin; oxygen applied via mask.  Decels have currently resolved. Labs: Lab Results  Component Value Date   WBC 11.9* 04/06/2012   HGB 12.9 04/06/2012   HCT 38.1 04/06/2012   MCV 81.4 04/06/2012   PLT 324 04/06/2012    Assessment / Plan: 1. [redacted]w[redacted]d 2.PIH 3. Transition  Labor: Progressing normally Preeclampsia:  intake and ouput balanced Fetal Wellbeing:  Category II Pain Control:  Epidural I/D:  n/a Anticipated MOD:  NSVD 1.  Will CTO FHT closely.  Amnioinfusion to be restarted prn.  Will leave Pitocin off at present. 2. C/w MD prn Mckenzie Gordon 04/07/2012, 2:24 AM

## 2012-04-07 NOTE — Progress Notes (Signed)
Subjective: Postpartum Day 0: Cesarean Delivery Patient reports tolerating PO.  Foley in place, did ambulate to BR without difficulty, pain well controlled Denies N/V/HA/RUQ pain  RN called about 45 min ago to report UO over the last 6 hours 200cc, instructed to give 300c LR bolus, will CTO closely   Objective: Vital signs in last 24 hours: Temp:  [97.4 F (36.3 C)-98.6 F (37 C)] 98.2 F (36.8 C) (11/02 2100) Pulse Rate:  [62-171] 88  (11/02 2100) Resp:  [17-24] 20  (11/02 2100) BP: (90-167)/(16-98) 111/72 mmHg (11/02 2100) SpO2:  [92 %-100 %] 97 % (11/02 2100) Weight:  [213 lb (96.616 kg)] 213 lb (96.616 kg) (11/02 0816)  Physical Exam:  General: alert, fatigued and no distress Lochia: appropriate Uterine Fundus: firm Incision: dressing CDI  DVT Evaluation: No evidence of DVT seen on physical exam. Negative Homan's sign. No significant calf/ankle edema.   Basename 04/06/12 1538  HGB 12.9  HCT 38.1    Assessment/Plan: Status post Cesarean section. Postoperative course complicated by pre-eclampsia, as evidenced by proteinuria     Will tx to AICU, initiate mag sulfate 4g bolus and 2g/hour x24 hours  Daily weights Strict intake and output  Will repeat PIH labs in the Am, as well as mag level  D/w Dr Su Hilt rv'd w pt and sig other, questions answered, agree w plan  Malissa Hippo 04/07/2012, 11:30 PM

## 2012-04-08 LAB — COMPREHENSIVE METABOLIC PANEL
Albumin: 1.8 g/dL — ABNORMAL LOW (ref 3.5–5.2)
Alkaline Phosphatase: 107 U/L (ref 39–117)
BUN: 7 mg/dL (ref 6–23)
Calcium: 7.7 mg/dL — ABNORMAL LOW (ref 8.4–10.5)
Creatinine, Ser: 0.57 mg/dL (ref 0.50–1.10)
GFR calc Af Amer: >90 mL/min (ref >90)
Glucose, Bld: 86 mg/dL (ref 70–99)
Potassium: 3.4 mEq/L — ABNORMAL LOW (ref 3.5–5.1)
Total Protein: 4.7 g/dL — ABNORMAL LOW (ref 6.0–8.3)

## 2012-04-08 LAB — MAGNESIUM: Magnesium: 3.8 mg/dL — ABNORMAL HIGH (ref 1.5–2.5)

## 2012-04-08 LAB — CBC
HCT: 28.3 % — ABNORMAL LOW (ref 36.0–46.0)
Hemoglobin: 9.3 g/dL — ABNORMAL LOW (ref 12.0–15.0)
MCH: 27.4 pg (ref 26.0–34.0)
MCHC: 32.9 g/dL (ref 30.0–36.0)
RDW: 15.3 % (ref 11.5–15.5)

## 2012-04-08 LAB — URIC ACID: Uric Acid, Serum: 4.5 mg/dL (ref 2.4–7.0)

## 2012-04-08 LAB — LACTATE DEHYDROGENASE: LDH: 205 U/L (ref 94–250)

## 2012-04-08 MED ORDER — POLYSACCHARIDE IRON COMPLEX 150 MG PO CAPS
150.0000 mg | ORAL_CAPSULE | Freq: Two times a day (BID) | ORAL | Status: DC
  Administered 2012-04-08 – 2012-04-10 (×5): 150 mg via ORAL
  Filled 2012-04-08 (×7): qty 1

## 2012-04-08 NOTE — Clinical Social Work Note (Signed)
CSW attempted x2 to see MOB today.  CSW will pass on to weekday CSW to try to consult with MOB.   

## 2012-04-08 NOTE — Progress Notes (Signed)
Fluid vol doubled for daylight savings

## 2012-04-08 NOTE — Progress Notes (Addendum)
Subjective: Postpartum Day 1: Cesarean Delivery Patient reports incisional pain, tolerating PO and + flatus.   Working on latch, but great colostrum output.  S.o. And his parents remain at bedside. Objective: Vital signs in last 24 hours: Temp:  [97.8 F (36.6 C)-98.7 F (37.1 C)] 97.8 F (36.6 C) (11/03 0800) Pulse Rate:  [62-98] 83  (11/03 0900) Resp:  [16-20] 20  (11/03 0900) BP: (91-127)/(44-72) 114/69 mmHg (11/03 0900) SpO2:  [95 %-99 %] 98 % (11/03 0900)  Physical Exam:  General: alert, cooperative and no distress; twitching is less than in L&D Lungs:  cta bilaterally CV:  RRR Lochia: appropriate Abd: soft, appropriately tender; BS+x4 Uterine Fundus: firm Incision: old drainage noted; post-op dsg intact DVT Evaluation: No evidence of DVT seen on physical exam. Negative Homan's sign. SCD's on; BLE 1+ pitting edema; Rt>Lt No clonus; Brisk reflexes BLE   Basename 04/08/12 0555 04/06/12 1538  HGB 9.3* 12.9  HCT 28.3* 38.1    Assessment/Plan: Status post Cesarean section for NRFHT. Postoperative course complicated by preeclampsia and anemia  Continue current care.  Nu-iron bid.  Continue Magnesium sulfate x24 hrs.    STEELMAN,CANDICE H 04/08/2012, 9:41 AM   Agree with above.  UOP is good.  Pt requesting for foley to be d/c'd.  Will d/c foley and cont I's/O's.  Will d/c mg after 24hrs.

## 2012-04-08 NOTE — Progress Notes (Deleted)
Continued to hold Tdap vaccine until platelet count improves.

## 2012-04-09 ENCOUNTER — Encounter (HOSPITAL_COMMUNITY): Payer: Self-pay | Admitting: Obstetrics and Gynecology

## 2012-04-09 MED ORDER — PNEUMOCOCCAL VAC POLYVALENT 25 MCG/0.5ML IJ INJ
0.5000 mL | INJECTION | INTRAMUSCULAR | Status: AC
  Administered 2012-04-10: 0.5 mL via INTRAMUSCULAR
  Filled 2012-04-09: qty 0.5

## 2012-04-09 NOTE — Progress Notes (Signed)
UR chart review completed.  

## 2012-04-09 NOTE — Progress Notes (Addendum)
Subjective: Postpartum Day 2: Cesarean Delivery due to [facility] Patient up ad lib, reports no syncope or dizziness. Feeding:  Breast, with some supplementation Contraceptive plan:  Undecided at present--considering Micronor, Depo (previous user), or Nexplanon Feels anxiety is much improved.  Objective: Vital signs in last 24 hours: Temp:  [97.5 F (36.4 C)-97.8 F (36.6 C)] 97.5 F (36.4 C) (11/04 0800) Pulse Rate:  [69-92] 76  (11/04 0800) Resp:  [18-20] 18  (11/04 0800) BP: (107-149)/(45-90) 133/90 mmHg (11/04 0800) SpO2:  [96 %-99 %] 99 % (11/04 0800) Weight:  [222 lb 1.6 oz (100.744 kg)] 222 lb 1.6 oz (100.744 kg) (11/04 0500)  I/O 24 hours = +700 Urine output x 24 hours = 3125 cc  Filed Vitals:   04/09/12 0505 04/09/12 0506 04/09/12 0600 04/09/12 0800  BP: 116/71  134/75 133/90  Pulse:  83 69 76  Temp:    97.5 F (36.4 C)  TempSrc:    Oral  Resp:    18  Weight:      SpO2:  98% 98% 99%   Completed 24 hours of magnesium sulfate at 11:25pm  Physical Exam:  Still slightly "twitchy", but no obvious evidence of panic/anxiety attack. General: alert Lochia: appropriate Uterine Fundus: firm Incision: healing well DVT Evaluation: No evidence of DVT seen on physical exam.  DTRs still 3+, with 1 beat clonus Negative Homan's sign. Calf/Ankle edema is present, 1+ JP drain:   NA   Basename 04/08/12 0555 04/06/12 1538  HGB 9.3* 12.9  HCT 28.3* 38.1    Assessment/Plan: Status post Cesarean section day 2. Doing well postoperatively. Pre-eclampsia--completed 24 hours of magnesium Anxiety stable   Plan: Transfer to [facility] LCs working with patient on breastfeeding/latching. Daily weights Anticipate d/c tomorrow. Reviewed contraceptive options--patient will decide.     Nigel Bridgeman 04/09/2012, 9:53 AM

## 2012-04-10 ENCOUNTER — Encounter (HOSPITAL_COMMUNITY)
Admission: RE | Admit: 2012-04-10 | Discharge: 2012-04-10 | Disposition: A | Discharge status: Home / Self Care | Payer: Self-pay | Source: Ambulatory Visit | Attending: Obstetrics and Gynecology | Admitting: Obstetrics and Gynecology

## 2012-04-10 DIAGNOSIS — O923 Agalactia: Secondary | ICD-10-CM | POA: Insufficient documentation

## 2012-04-10 MED ORDER — OXYCODONE-ACETAMINOPHEN 5-325 MG PO TABS
1.0000 | ORAL_TABLET | ORAL | Status: DC | PRN
Start: 2012-04-10 — End: 2012-04-21

## 2012-04-10 MED ORDER — IBUPROFEN 600 MG PO TABS: 600.0000 mg | ORAL_TABLET | Freq: Four times a day (QID) | ORAL | Status: AC | PRN

## 2012-04-10 NOTE — Discharge Summary (Signed)
Obstetric Discharge Summary Reason for Admission: onset of labor, elevated BP Prenatal Procedures: NST, Preeclampsia and ultrasound Intrapartum Procedures: cesarean: low cervical, transverse, due to NRFHR in 2nd stage. Postpartum Procedures: Magnesium sulfate x 24 hours post delivery Complications-Operative and Postpartum: none Hemoglobin  Date Value Range Status  04/08/2012 9.3* 12.0 - 15.0 g/dL Final     DELTA CHECK NOTED     REPEATED TO VERIFY     HCT  Date Value Range Status  04/08/2012 28.3* 36.0 - 46.0 % Final   Hospital Course:  Admitted in early labor on 04/06/12, with elevated BP noted.  Negative GBS. Progressed well. Utilized epidural for pain management.  PIH labs were done on admission, due to elevated BP, with normal findings.  24 hour urine was initiated during labor after epidural was placed.  Patient progressed to completely dilated, but then had a series of prolonged decels with pushing attempts.  Dr. Su Hilt was consulted, and the decision was made to recommend C/S delivery at that time.  Delivery was performed by Dr, Su Hilt without complication. Patient and baby tolerated the procedure without difficulty   Infant status was stable and remained in room with mother.  Patient's 24 hour urine protein was resulted at 1050 mg on 04/06/12, and magnesium sulfate was begun for a 24 hour course.  Her BP was initially still mildly elevated after completing the magnesium, then it decreased to 130s-140s/70s-80s.  She lost 7 lbs over 24 hours from day 2 to day 3.  She was deemed to have received full benefit of her hospital stay on day 3, and was discharged home.  She and her husband plan to move to [place] in the next 1-2 weeks, and they have arranged for pediatric care there for their daughter.  Breastfeeding was going well. Mom's physical exam was WNL on day 3, and she was discharged home in stable condition. Contraception plan was possibly Nexplanon, Depo (previous user), or Mirena, with a  decision to be made pp.  She received adequate benefit from po pain medications and was sent home with Percocet and Motrin.  [facility] nurses were requested to see the patient on 11/8 for BP check.      Physical Exam:  General: alert Lochia: appropriate Uterine Fundus: firm Incision: healing well DVT Evaluation: No evidence of DVT seen on physical exam. Negative Homan's sign. Calf/Ankle edema is present.  Discharge Diagnoses: Term Pregnancy-delivered, Preelampsia and Mild anemia  Discharge Information: Date: 04/10/2012 Activity: Per [facility] handout Diet: routine Medications: Ibuprofen, Colace, Percocet and OTC Fe Condition: stable Instructions: refer to practice specific booklet Discharge to: home Contraception:  Considering Nexplanon, Depo, or Mirena Follow-up Information    Follow up with [facility]. Schedule an appointment as soon as possible for a visit in 6 weeks. (Call with any questions or concerns)    Contact information:   [address] [place] [address] [phone]         Newborn Data: Live born female  Birth Weight: 6 lb 7 oz (2920 g) APGAR: 8, 9  Home with mother.  Perle Gibbon 04/10/2012, 9:05 AM

## 2012-04-11 ENCOUNTER — Encounter: Payer: Medicaid Other | Admitting: Obstetrics and Gynecology

## 2012-04-12 NOTE — Clinical Social Work Maternal (Signed)
Clinical Social Work Department PSYCHOSOCIAL ASSESSMENT - MATERNAL/CHILD 04/10/2012-Late Entry  Patient:  Mckenzie Gordon  Account Number:  [id number]  Admit Date:  04/06/2012  Marjo Bicker Name:   Mckenzie Gordon    Clinical Social Worker:  Mckenzie Riding, LCSW   Date/Time:  04/10/2012 12:00 N  Date Referred:  04/10/2012   Referral source  CN     Referred reason  Behavioral Health Issues   Other referral source:    I:  FAMILY / HOME ENVIRONMENT Child's legal guardian:  PARENT  Guardian - Name Guardian - Age Guardian - Address  Mckenzie Gordon [address], [place], [place] [address]  [name]  same/[place]   Other household support members/support persons Other support:   Parents report having a good support system.    II  PSYCHOSOCIAL DATA Information Source:  Family Interview  Financial and [employer] Employment:   FOB is an Pensions consultant and got a job in [place] in August.  He has already moved there and is communting back and forth  MOB is not currently working   Surveyor, quantity resources:  [employer] If [employer] - County:  [employer] / Grade:   Maternity Care Coordinator / Child Services Coordination / Early Interventions:  Cultural issues impacting care:   None identified    III  STRENGTHS Strengths  Adequate Resources  Compliance with medical plan  Home prepared for Child (including basic supplies)  Supportive family/friends   Strength comment:    IV  RISK FACTORS AND CURRENT PROBLEMS Current Problem:  YES   Risk Factor & Current Problem Patient Issue Family Issue Risk Factor / Current Problem Comment  Mental Illness Y N MOB-Anxiety    V  SOCIAL WORK ASSESSMENT CSW met with parents in MOB's first floor room to complete assessment.  Parents were very friendly and seemed to appreciate CSW's visit.  MOB states concerns with her Anxiety and states she has a Veterinary surgeon at [employer] who she will continue to see even after moving to  [place] with FOB.  CSW validated her concerns and encouraged her to maintain relationship with her counselor through a time of transition in her life.  FOB was very calm and states he has seen MOB experience Anxiety and panic attacks and thinks he knows what to look for and how to assist her during these times.  MOB was visably anxious as we spoke. She stood up holding the baby and rubbing his head lovingly, yet vigorously.  She was very interested when CSW began talking about signs and symptoms of PPD.  She states she feels comfortable calling her doctor if symptoms arise and knows she can call her counselor at any time.  She went off medication when she got pregnant, but is considering talking with her doctor to go back on medication.  CSW recommends this given her hx and current level of Anxiety. CSW notes a strong support system and good self awareness by MOB.  CSW provided encouragement and parents were appreciative and stated no further questions or needs.  He states his mother is coming to stay for the next two weeks and is an NNP who will be very helpful.      VI SOCIAL WORK PLAN Social Work Plan  No Further Intervention Required / No Barriers to Discharge   Type of pt/family education:   PPD signs and symptoms   If child protective services report - county:   If child protective services report - date:   Information/referral to  community resources comment:   None identified   Other social work plan:

## 2012-04-13 ENCOUNTER — Encounter: Payer: Medicaid Other | Admitting: Obstetrics and Gynecology

## 2012-04-16 ENCOUNTER — Ambulatory Visit (HOSPITAL_COMMUNITY)
Admit: 2012-04-16 | Discharge: 2012-04-16 | Disposition: A | Discharge status: Home / Self Care | Payer: Medicaid Other | Attending: Obstetrics and Gynecology | Admitting: Obstetrics and Gynecology

## 2012-04-16 NOTE — Progress Notes (Signed)
Adult Lactation Consultation Outpatient Visit Note  Patient Name: Mckenzie Gordon                     WUJW Boy Mckenzie Gordon, DOB [birth year], Birth Weight  6 lb. 7 oz. Now 23 days old Date of Birth: 12-20-1980 Gestational Age at Delivery: Unknown Type of Delivery: C/S  Breastfeeding History: Frequency of Breastfeeding: every 2-3 hours Length of Feeding: on average 20 minutes on 1st breast, 10 minutes on second breast Voids: 6-8/day Stools: Mckenzie Gordon had is 1st stool since last Thursday, 04/12/12 last night. Large stool last night, yellow/green/brown in color  Supplementing / Method: Pumping:  Type of Pump:  Symphony DEBP. Has purchased a Lansinoh DEBP   Frequency:  Every 2-3 hours for 15 minutes  Volume:  Usually 7 ml from right breast, 2 ml from left  Comments: Mom is here for feeding assessment. She reports her milk coming in last night. She also reports the Mckenzie was latching well except the last few days he is not sustaining the latch. Tried SNS but has not been successful using that at home. Using Calma bottle supplement 4-6 times per day and Mckenzie Gordon has been taking 20 ml of Mckenzie Gordon as supplement. Mom reports he spits up after each feeding. At night, Mom has been just supplementing with formula 1 1/2 - 2 oz, usually 2 feedings at night. Mom has not been pumping at night. Mom has history of Breast augmentation in 2001, Pre-eclampsia and on Mag after this delivery.    Consultation Evaluation: Mom left breast is soft, the right breast is slightly firm. Mom latched Mckenzie Gordon well at this visit. Assisted with obtaining more depth and maintaining a deep latch during breastfeeding. Mckenzie Gordon demonstrated a good sucking rhythm with swallows audible. Mom does not report discomfort with breastfeeding.   Initial Feeding Assessment: Pre-feed Weight:  6 lb. 2.5oz/2792 gm Post-feed Weight:  6 lb. 3.1 oz/2810 gm Amount Transferred:  18 ml. Comments: From left breast  with nursing for 15 minutes.  Additional Feeding Assessment: Pre-feed Weight:  6 lb. 3.1 oz/2810 gm Post-feed Weight:  6 lb. 4.5 oz/2848 gm Amount Transferred:  38 ml Comments:  From right breast with nursing for 24 minutes  Additional Feeding Assessment: Pre-feed Weight: Post-feed Weight: Amount Transferred: Comments:  Total Breast milk Transferred this Visit: 56 ml Total Supplement Given: None  Additional Interventions: Information given to start Moringa supplements to support breast milk production. Breastfeed Mckenzie whenever he appears hungry or at least every 2-3 hours. Keep him active at the breast 15-30 minutes.  Post pump during the day for 15 minutes, stop massage breast, then pump another 5 minutes to maintain milk production. Give the Mckenzie back any amount of EBM available from pumping. If Mckenzie appears hungry after breastfeeding and you have no EBM available, give the Mckenzie 15-20 ml of formula unless he continues to spit up after each feeding. Keep the Mckenzie upright after feedings for 15-20 minutes.  Recommend weight check first on next week. Come to support group on Tuesday, from 11:00 to 12:00 Follow up with LC at your Peds office.    Follow-Up  See above or prn    Mckenzie Gordon 04/16/2012, 1:12 PM

## 2012-04-16 NOTE — Progress Notes (Signed)
Adult Lactation Consultation Outpatient Visit Note  Patient Name: Mckenzie Gordon                     GEXB Boy Mckenzie Gordon, DOB [birth year], Birth Weight  6 lb. 7 oz. Now 44 days old Date of Birth: April 29, 1981 Gestational Age at Delivery: Unknown Type of Delivery: C/S  Breastfeeding History: Frequency of Breastfeeding: every 2-3 hours Length of Feeding: on average 20 minutes on 1st breast, 10 minutes on second breast Voids: 6-8/day Stools: Baby Mckenzie Gordon had is 1st stool since last Thursday, 04/12/12 last night. Large stool last night, yellow/green/brown in color  Supplementing / Method: Pumping:  Type of Pump:  Symphony DEBP. Has purchased a Lansinoh DEBP   Frequency:  Every 2-3 hours for 15 minutes  Volume:  Usually 7 ml from right breast, 2 ml from left  Comments: Mom is here for feeding assessment. She reports her milk coming in last night. She also reports the baby was latching well except the last few days he is not sustaining the latch. Tried SNS but has not been successful using that at home. Using Calma bottle supplement 4-6 times per day and Baby Mckenzie Gordon has been taking 20 ml of [employer] as supplement. Mom reports he spits up after each feeding. At night, Mom has been just supplementing with formula 1 1/2 - 2 oz, usually 2 feedings at night. Mom has not been pumping at night. Mom has history of Breast augmentation in 2001, Pre-eclampsia and on Mag after this delivery.    Consultation Evaluation: Mom left breast is soft, the right breast is slightly firm. Mom latched [name] well at this visit. Assisted with obtaining more depth and maintaining a deep latch during breastfeeding. [name] demonstrated a good sucking rhythm with swallows audible. Mom does not report discomfort with breastfeeding.   Initial Feeding Assessment: Pre-feed Weight:  6 lb. 2.5oz/2792 gm Post-feed Weight:  6 lb. 3.1 oz/2810 gm Amount Transferred:  18 ml. Comments: From left breast  with nursing for 15 minutes.  Additional Feeding Assessment: Pre-feed Weight:  6 lb. 3.1 oz/2810 gm Post-feed Weight:  6 lb. 4.5 oz/2848 gm Amount Transferred:  38 ml Comments:  From right breast with nursing for 24 minutes  Additional Feeding Assessment: Pre-feed Weight: Post-feed Weight: Amount Transferred: Comments:  Total Breast milk Transferred this Visit: 56 ml Total Supplement Given: None  Additional Interventions: Information given to start Moringa supplements to support breast milk production. Breastfeed baby whenever he appears hungry or at least every 2-3 hours. Keep him active at the breast 15-30 minutes.  Post pump during the day for 15 minutes, stop massage breast, then pump another 5 minutes to maintain milk production. Give the baby back any amount of EBM available from pumping. If baby appears hungry after breastfeeding and you have no EBM available, give the baby 15-20 ml of formula unless he continues to spit up after each feeding. Keep the baby upright after feedings for 15-20 minutes.  Recommend weight check first on next week. Come to support group on Tuesday, from 11:00 to 12:00 Follow up with LC at your Peds office.    Follow-Up  See above or prn    Alfred Levins 04/16/2012, 2:18 PM

## 2012-04-19 ENCOUNTER — Other Ambulatory Visit: Payer: Self-pay

## 2012-04-19 ENCOUNTER — Telehealth: Payer: Self-pay | Admitting: Obstetrics and Gynecology

## 2012-04-19 NOTE — Telephone Encounter (Signed)
Spoke with pt rgd msg. Pt stated she is still in pain from her c/s done on the 04/07/12. Spoke with mary oncall advised pt to keep taking her Ibprofen as need for pain. Pt stated she moved to [place] and will not be able to come to the office . Advised pt is still in pain to contact the mid-wife on call . Pt's voice understanding

## 2012-04-21 ENCOUNTER — Other Ambulatory Visit: Payer: Self-pay | Admitting: Obstetrics and Gynecology

## 2012-04-21 ENCOUNTER — Telehealth: Payer: Self-pay | Admitting: Obstetrics and Gynecology

## 2012-04-21 DIAGNOSIS — O34219 Maternal care for unspecified type scar from previous cesarean delivery: Secondary | ICD-10-CM

## 2012-04-21 MED ORDER — OXYCODONE-ACETAMINOPHEN 5-325 MG PO TABS: 1.0000 | ORAL_TABLET | ORAL | Status: DC | PRN

## 2012-04-21 NOTE — Addendum Note (Signed)
Addended by: Cornelius Moras on: 04/21/2012 04:46 PM   Modules accepted: Orders

## 2012-04-21 NOTE — Telephone Encounter (Signed)
TC from patient--s/p C/S on 11/2.  In process of moving to [place], [place]. Using Motrin for pain, had called office on 11/14 requesting refill on Percocet, due to continued pain on movement and turning. No fever, drainage from incision, or any other issue.  Will do another written Rx for Percocet, with patient coming to [facility] to pick up (she is here in [place] today). To call if pain persists.

## 2012-04-23 ENCOUNTER — Telehealth: Payer: Self-pay | Admitting: Obstetrics and Gynecology

## 2012-04-23 NOTE — Telephone Encounter (Signed)
VM from pt. 04/21/12 at 4:13pm. Requesting Rf Percocet.  S/P C/S 04/07/12.  States called earlier in week and was told to call over the weekend if needed.

## 2012-04-23 NOTE — Telephone Encounter (Signed)
Chart reviewed.  Pt spoke with VL 04/21/12.  Was to obtain Rx for Percocet at hospital that day.

## 2012-05-10 ENCOUNTER — Encounter (HOSPITAL_COMMUNITY)
Admission: RE | Admit: 2012-05-10 | Discharge: 2012-05-10 | Disposition: A | Discharge status: Home / Self Care | Payer: Self-pay | Source: Ambulatory Visit | Attending: Obstetrics and Gynecology | Admitting: Obstetrics and Gynecology

## 2012-05-10 DIAGNOSIS — O923 Agalactia: Secondary | ICD-10-CM | POA: Insufficient documentation

## 2012-05-16 ENCOUNTER — Ambulatory Visit (INDEPENDENT_AMBULATORY_CARE_PROVIDER_SITE_OTHER): Payer: Medicaid Other

## 2012-05-16 NOTE — Progress Notes (Signed)
Mckenzie Gordon  is 5 1/2 weeks postpartum following a primary cesarean section, low transverse incision at [redacted]w[redacted]d gestational weeks Date: 04/07/12 female baby named [name] delivered by AR, MD and HS, CNM.  Lives in [place] now for s.o's job.  No PCP there yet.  Treated for PreEclampsia after delivery, but no antihypertensives since.  Infant had RSV in last two weeks and is only around 7lbs at present.  He is having some reflux, and setbacks w/ lactating w/ him being sick, but plans to continue.  Also starting 30-min P90X and wants to do some mom/baby classes.  Denies PIH s/s.  Doesn't want to start medicine for BP if possible.  Pt is SAHM.  Still has house here and comes down to pay mortgage.    Breastfeeding: yes Bottlefeeding:  no  Post-partum blues / depression:  no  EPDS score: 8  History of abnormal Pap:  no  Last Pap: Date  12/28/11 Gestational diabetes:  no  Contraception:  Desires condoms  Normal urinary function:  yes Normal GI function:  yes Returning to work:  No  O:  .Marland Kitchen Filed Vitals:   05/16/12 1054 05/16/12 1123  BP: 160/100 140/100  Resp: 16   Weight: 198 lb (89.812 kg)   Gen:  A&Ox3, NAD Abdomen:  Soft, NT, no organomegaly, 1FB diastasis Incision: well healed, pink; small, circular hard area about 1-2 in superior to incision and to pt's Rt; question if scar tissue Uterus: normal involution Bimanual exam: WNL, Kegel 4/5; normal adnexa Ext:  No edema; DTRs 2+ BLE;   A:  1. 5.5 weeks PP       2. Lactating       3. PIH vs CHTN       4. S/p c/s for NRFHT       5. H/o PP PreEclampsia       6.  Plans nexplanon       7. H/o anxiety/depression/ADHD; no PPD currently  P:  1. RTO in 1-2 weeks for insertion of nexplanon; abstinence until then       2. PIH precautions rev'd; watch Na in diet.  Rec'd pt f/u w/ her PCP here or proceed w/ adopting new one in [place] to recheck BP in the next month.        3.  U/s of RLQ if area above incision doesn't resolve or worsens     4.   Return to normal activities; daily vitamin,kegels, sunscreen, healthy lifestyle C. Denny Levy, [place] 05/16/12

## 2012-06-10 ENCOUNTER — Encounter (HOSPITAL_COMMUNITY)
Admission: RE | Admit: 2012-06-10 | Discharge: 2012-06-10 | Disposition: A | Discharge status: Home / Self Care | Payer: Self-pay | Source: Ambulatory Visit | Attending: Obstetrics and Gynecology | Admitting: Obstetrics and Gynecology

## 2012-06-10 DIAGNOSIS — O923 Agalactia: Secondary | ICD-10-CM | POA: Insufficient documentation

## 2012-06-18 ENCOUNTER — Encounter: Payer: Medicaid Other | Admitting: Obstetrics and Gynecology

## 2012-07-11 ENCOUNTER — Encounter (HOSPITAL_COMMUNITY)
Admission: RE | Admit: 2012-07-11 | Discharge: 2012-07-11 | Disposition: A | Discharge status: Home / Self Care | Payer: Self-pay | Source: Ambulatory Visit | Attending: Obstetrics and Gynecology | Admitting: Obstetrics and Gynecology

## 2012-07-11 ENCOUNTER — Encounter: Payer: Self-pay | Admitting: Obstetrics and Gynecology

## 2012-07-11 ENCOUNTER — Telehealth: Payer: Self-pay | Admitting: Obstetrics and Gynecology

## 2012-07-11 DIAGNOSIS — O923 Agalactia: Secondary | ICD-10-CM | POA: Insufficient documentation

## 2012-07-11 NOTE — Telephone Encounter (Signed)
Spoke with pt rgd msg . Pt stated she wanted a letter of letting her to go to pp exercise classes. Advised pt that i sent vicki a letter . And would contact pt back most likely tomorrow . Pt voice understanding

## 2012-08-09 ENCOUNTER — Encounter (HOSPITAL_COMMUNITY)
Admission: RE | Admit: 2012-08-09 | Discharge: 2012-08-09 | Disposition: A | Discharge status: Home / Self Care | Payer: Self-pay | Source: Ambulatory Visit | Attending: Obstetrics and Gynecology | Admitting: Obstetrics and Gynecology

## 2012-08-09 DIAGNOSIS — O923 Agalactia: Secondary | ICD-10-CM | POA: Insufficient documentation

## 2012-10-01 ENCOUNTER — Other Ambulatory Visit (HOSPITAL_COMMUNITY)
Admission: RE | Admit: 2012-10-01 | Discharge: 2012-10-01 | Disposition: A | Discharge status: Home / Self Care | Payer: Self-pay | Source: Ambulatory Visit | Attending: Internal Medicine | Admitting: Internal Medicine

## 2012-10-01 ENCOUNTER — Ambulatory Visit (INDEPENDENT_AMBULATORY_CARE_PROVIDER_SITE_OTHER): Payer: Self-pay | Admitting: Internal Medicine

## 2012-10-01 ENCOUNTER — Encounter: Payer: Self-pay | Admitting: Internal Medicine

## 2012-10-01 VITALS — BP 140/94 | HR 84 | Temp 99.4°F | Ht 64.0 in | Wt 209.0 lb

## 2012-10-01 DIAGNOSIS — R030 Elevated blood-pressure reading, without diagnosis of hypertension: Secondary | ICD-10-CM

## 2012-10-01 DIAGNOSIS — B373 Candidiasis of vulva and vagina: Secondary | ICD-10-CM

## 2012-10-01 DIAGNOSIS — E669 Obesity, unspecified: Secondary | ICD-10-CM

## 2012-10-01 DIAGNOSIS — Z0000 Encounter for general adult medical examination without abnormal findings: Secondary | ICD-10-CM

## 2012-10-01 DIAGNOSIS — Z01419 Encounter for gynecological examination (general) (routine) without abnormal findings: Secondary | ICD-10-CM | POA: Insufficient documentation

## 2012-10-01 LAB — COMPREHENSIVE METABOLIC PANEL
CO2: 21 mEq/L (ref 19–32)
Glucose, Bld: 94 mg/dL (ref 70–99)
Sodium: 137 mEq/L (ref 135–145)
Total Bilirubin: 0.3 mg/dL (ref 0.3–1.2)
Total Protein: 6.9 g/dL (ref 6.0–8.3)

## 2012-10-01 LAB — CBC WITH DIFFERENTIAL/PLATELET
Eosinophils Absolute: 0.1 10*3/uL (ref 0.0–0.7)
Eosinophils Relative: 2 % (ref 0–5)
HCT: 41.1 % (ref 36.0–46.0)
Hemoglobin: 13.8 g/dL (ref 12.0–15.0)
Lymphs Abs: 2.4 10*3/uL (ref 0.7–4.0)
MCH: 26.7 pg (ref 26.0–34.0)
MCV: 79.7 fL (ref 78.0–100.0)
Monocytes Absolute: 0.5 10*3/uL (ref 0.1–1.0)
Monocytes Relative: 7 % (ref 3–12)
Neutrophils Relative %: 60 % (ref 43–77)
RBC: 5.16 MIL/uL — ABNORMAL HIGH (ref 3.87–5.11)

## 2012-10-01 LAB — POCT URINALYSIS DIPSTICK
Bilirubin, UA: NEGATIVE
Ketones, UA: NEGATIVE
Leukocytes, UA: NEGATIVE
pH, UA: 5.5

## 2012-10-01 LAB — LIPID PANEL
Cholesterol: 189 mg/dL (ref 0–200)
Triglycerides: 158 mg/dL — ABNORMAL HIGH (ref <150)
VLDL: 32 mg/dL (ref 0–40)

## 2012-10-01 NOTE — Patient Instructions (Addendum)
Get blood pressure readings for 2 weeks and recontact me for further instructions. Terazol 7 vaginal cream each bedtime x7 days for Candida vaginitis.

## 2012-10-01 NOTE — Progress Notes (Signed)
Subjective:    Patient ID: Mckenzie Gordon, female    DOB: Oct 01, 1980, 32 y.o.   MRN: [id number]  HPI 32 year old White female graduate of [employer] recently moved to [place], [place] where her fianc got a job as an Pensions consultant. Patient currently not working. She has a 5 1/67-month-old son. Patient says she had a history of preeclampsia complicating her pregnancy. Says  blood pressure has been elevated since birth of her son. At one point, she tried some herbs to help with lactation but realized that elevated her blood pressure so she stopped. Says blood pressure has remained elevated even though she stopped the herbs. She is overweight. Doesn't exercise. Currently not breast-feeding.  Last physical exam July 2012. Patient has history of breast augmentation by Dr. Shon Hough July 2001. History of left patellar dislocation 2001. History of migraine headaches. History of appendectomy. History of atypical nevus left lateral breast shave [facility]ops[facility] [place].  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 While in school [employer] around 2004, she disappeared and was on drugs. Family subsequently found her. She lived in [place] for a while and got a job at [employer] and began to return to college in at [employer]. around 2007 and did well with no further recurrence of drug addiction. Patient has history of attention deficit issues. 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 She took Adderall 20 mg daily in 20 04/26/2011 to help her study. She wants to take bar exam again this summer and wants to be on attention deficit medication but I'm not comfortable with that with her elevated blood pressure.  Social history: Engaged but not married. Living with fianc and 5-1/49-month-old son in [place].  Family history: Her mother had history of alcoholism and was killed in a tragic RV accident. Father alive. Maternal  grandmother died suddenly presumably of CVA. Maternal grandfather with history of coronary disease and alcoholism.     Review of Systems  Constitutional: Negative.   HENT: Negative.   Eyes: Negative.   Respiratory: Negative.   Cardiovascular: Negative.   Gastrointestinal: Negative.   Endocrine: Negative.   Genitourinary:       Itchy vaginal discharge  Allergic/Immunologic:       Allergic rhinitis symptoms for which she has been taking Claritin  Neurological:       History of migraine headaches  Psychiatric/Behavioral:       History of attention deficit. Remote history of depression.       Objective:   Physical Exam  Vitals reviewed. Constitutional: She is oriented to person, place, and time. She appears well-developed and well-nourished. No distress.  Overweight  HENT:  Head: Normocephalic and atraumatic.  Right Ear: External ear normal.  Left Ear: External ear normal.  Mouth/Throat: Oropharynx is clear and moist. No oropharyngeal exudate.  Eyes: Conjunctivae and EOM are normal. Pupils are equal, round, and reactive to light. Left eye exhibits no discharge. No scleral icterus.  Neck: Neck supple. No JVD present. No thyromegaly present.  Cardiovascular: Normal rate, regular rhythm, normal heart sounds and intact distal pulses.   No murmur heard. Pulmonary/Chest: Effort normal and breath sounds normal. No respiratory distress. She has no wheezes. She has no rales.  History of breast augmentation  Abdominal: Soft. Bowel sounds are normal. There is no tenderness. There is no rebound and no guarding.  Genitourinary: Uterus normal. Vaginal discharge found.  Pap  taken. White vaginal discharge.  Musculoskeletal: Normal range of motion. She exhibits no edema.  Lymphadenopathy:    She has no cervical adenopathy.  Neurological: She is alert and oriented to person, place, and time. She has normal reflexes. No cranial nerve deficit. Coordination normal.  Skin: Skin is warm and dry.  No rash noted. She is not diaphoretic.  Psychiatric: She has a normal mood and affect. Her behavior is normal. Judgment and thought content normal.          Assessment & Plan:  Probable Candida vaginitis  Obesity  Elevated blood pressure  History of preeclampsia  Plan: Patient will purchase home blood pressure monitoring keep readings for 2 weeks. She will then contact me for further instructions. Treat Candida vaginitis with Terazol 7 vaginal cream each bedtime x7 days. At this point I am not comfortable treating her for attention deficit with elevated blood pressure. Explained to her that 20% of patients who experienced preeclampsia will go on to develop hypertension. I am thinking this is going to happen in her case. She needs to diet exercise and lose weight.

## 2012-12-21 DIAGNOSIS — Z0289 Encounter for other administrative examinations: Secondary | ICD-10-CM

## 2014-03-10 ENCOUNTER — Ambulatory Visit (INDEPENDENT_AMBULATORY_CARE_PROVIDER_SITE_OTHER): Payer: Self-pay | Admitting: Internal Medicine

## 2014-03-10 ENCOUNTER — Other Ambulatory Visit (HOSPITAL_COMMUNITY)
Admission: RE | Admit: 2014-03-10 | Discharge: 2014-03-10 | Disposition: A | Discharge status: Home / Self Care | Payer: Medicaid Other | Source: Ambulatory Visit | Attending: Internal Medicine | Admitting: Internal Medicine

## 2014-03-10 ENCOUNTER — Encounter: Payer: Self-pay | Admitting: Internal Medicine

## 2014-03-10 VITALS — BP 120/82 | HR 72 | Temp 98.7°F | Ht 63.0 in | Wt 180.0 lb

## 2014-03-10 DIAGNOSIS — Z01419 Encounter for gynecological examination (general) (routine) without abnormal findings: Secondary | ICD-10-CM | POA: Insufficient documentation

## 2014-03-10 DIAGNOSIS — F909 Attention-deficit hyperactivity disorder, unspecified type: Secondary | ICD-10-CM

## 2014-03-10 DIAGNOSIS — Z23 Encounter for immunization: Secondary | ICD-10-CM

## 2014-03-10 DIAGNOSIS — F411 Generalized anxiety disorder: Secondary | ICD-10-CM

## 2014-03-10 DIAGNOSIS — F988 Other specified behavioral and emotional disorders with onset usually occurring in childhood and adolescence: Secondary | ICD-10-CM

## 2014-03-10 DIAGNOSIS — Z1329 Encounter for screening for other suspected endocrine disorder: Secondary | ICD-10-CM

## 2014-03-10 DIAGNOSIS — E669 Obesity, unspecified: Secondary | ICD-10-CM

## 2014-03-10 DIAGNOSIS — Z1321 Encounter for screening for nutritional disorder: Secondary | ICD-10-CM

## 2014-03-10 DIAGNOSIS — Z13220 Encounter for screening for lipoid disorders: Secondary | ICD-10-CM

## 2014-03-10 DIAGNOSIS — Z130 Encounter for screening for diseases of the blood and blood-forming organs and certain disorders involving the immune mechanism: Secondary | ICD-10-CM

## 2014-03-10 DIAGNOSIS — Z0000 Encounter for general adult medical examination without abnormal findings: Secondary | ICD-10-CM

## 2014-03-10 LAB — CBC WITH DIFFERENTIAL/PLATELET
BASOS ABS: 0 10*3/uL (ref 0.0–0.1)
Basophils Relative: 0 % (ref 0–1)
EOS ABS: 0.1 10*3/uL (ref 0.0–0.7)
EOS PCT: 1 % (ref 0–5)
HEMATOCRIT: 44.8 % (ref 36.0–46.0)
Hemoglobin: 15.4 g/dL — ABNORMAL HIGH (ref 12.0–15.0)
LYMPHS PCT: 27 % (ref 12–46)
Lymphs Abs: 2.3 10*3/uL (ref 0.7–4.0)
MCH: 29.2 pg (ref 26.0–34.0)
MCHC: 34.4 g/dL (ref 30.0–36.0)
MCV: 84.8 fL (ref 78.0–100.0)
MONO ABS: 0.4 10*3/uL (ref 0.1–1.0)
Monocytes Relative: 5 % (ref 3–12)
Neutro Abs: 5.8 10*3/uL (ref 1.7–7.7)
Neutrophils Relative %: 67 % (ref 43–77)
PLATELETS: 457 10*3/uL — AB (ref 150–400)
RBC: 5.28 MIL/uL — ABNORMAL HIGH (ref 3.87–5.11)
RDW: 14.6 % (ref 11.5–15.5)
WBC: 8.7 10*3/uL (ref 4.0–10.5)

## 2014-03-10 LAB — POCT URINALYSIS DIPSTICK
Bilirubin, UA: NEGATIVE
Glucose, UA: NEGATIVE
KETONES UA: NEGATIVE
Leukocytes, UA: NEGATIVE
Nitrite, UA: NEGATIVE
PH UA: 7
PROTEIN UA: NEGATIVE
RBC UA: NEGATIVE
UROBILINOGEN UA: NEGATIVE

## 2014-03-10 LAB — COMPREHENSIVE METABOLIC PANEL
ALT: 19 U/L (ref 0–35)
AST: 21 U/L (ref 0–37)
Albumin: 4.9 g/dL (ref 3.5–5.2)
Alkaline Phosphatase: 71 U/L (ref 39–117)
BILIRUBIN TOTAL: 0.3 mg/dL (ref 0.2–1.2)
BUN: 9 mg/dL (ref 6–23)
CALCIUM: 10.1 mg/dL (ref 8.4–10.5)
CHLORIDE: 100 meq/L (ref 96–112)
CO2: 27 mEq/L (ref 19–32)
CREATININE: 0.75 mg/dL (ref 0.50–1.10)
Glucose, Bld: 84 mg/dL (ref 70–99)
Potassium: 4.5 mEq/L (ref 3.5–5.3)
Sodium: 139 mEq/L (ref 135–145)
Total Protein: 7.9 g/dL (ref 6.0–8.3)

## 2014-03-10 LAB — LIPID PANEL
CHOL/HDL RATIO: 3.7 ratio
CHOLESTEROL: 182 mg/dL (ref 0–200)
HDL: 49 mg/dL (ref >39)
LDL Cholesterol: 107 mg/dL — ABNORMAL HIGH (ref 0–99)
Triglycerides: 130 mg/dL (ref <150)
VLDL: 26 mg/dL (ref 0–40)

## 2014-03-10 LAB — TSH: TSH: 2.024 u[IU]/mL (ref 0.350–4.500)

## 2014-03-10 MED ORDER — CLONAZEPAM 0.5 MG PO TABS: 0.5000 mg | ORAL_TABLET | ORAL | Status: AC | PRN

## 2014-03-10 MED ORDER — BUPROPION HCL ER (SR) 150 MG PO TB12: 150.0000 mg | ORAL_TABLET | Freq: Two times a day (BID) | ORAL | Status: AC

## 2014-03-10 NOTE — Patient Instructions (Addendum)
RTC in one year or prn. It was a pleasure to see you today. Take Wellbutrin 150 mg XL daily

## 2014-03-11 LAB — VITAMIN D 25 HYDROXY (VIT D DEFICIENCY, FRACTURES): Vit D, 25-Hydroxy: 49 ng/mL (ref 30–89)

## 2014-03-13 LAB — CYTOLOGY - PAP

## 2014-04-05 ENCOUNTER — Encounter: Payer: Self-pay | Admitting: Internal Medicine

## 2014-04-05 NOTE — Progress Notes (Signed)
Subjective:    Patient ID: Mckenzie KittenKatherine H Gordon, female    DOB: 11-Jun-1980, 33 y.o.   MRN: [id number]  HPI  33 year old White Female in today for health maintenance exam. She is now living in [place]. History of preeclampsia with only pregnancy in 2013. Blood pressure today is normal.  Past medical history: History of breast augmentation by Dr. [place] July 2001. History of left patellar dislocation 2001. History of migraine headaches. History of appendectomy. History of atypical nevus left lateral breast which was shave biopsied by Dr[facility]. 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  While attending [employer] around 2004, she disappeared and was found to be on drugs. Family found her. She came back to [place] lived in [place] house for a while. She got a job at [employer]. She returned to college at [employer] around 2007 and did well with no further recurrence of drug addiction. Subsequently attended and graduated from [employer] school. History of attention deficit issues. Has taken Adderall in the past. Has now passed the bar exam. Looking for job.  Social history: Married to another Pensions consultantattorney. He was working in [place] with an attorney but apparently was laid off so he started his own firm recently. 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 One son.  Family history: Mother had history of alcoholism and was killed in a tragic RV accident in which she was a passenger. Father living. Maternal grandmother died suddenly presumably of CVA. Maternal grandfather with history of coronary disease and alcoholism. No siblings.    Review of Systems  Constitutional: Negative.   All other systems reviewed and are negative.      Objective:   Physical Exam  Vitals reviewed. Constitutional: She is oriented to person, place, and time. She  appears well-developed and well-nourished. No distress.  Overweight  HENT:  Head: Normocephalic and atraumatic.  Right Ear: External ear normal.  Left Ear: External ear normal.  Mouth/Throat: Oropharynx is clear and moist. No oropharyngeal exudate.  Eyes: Conjunctivae are normal. Pupils are equal, round, and reactive to light. Right eye exhibits no discharge. Left eye exhibits no discharge. No scleral icterus.  Neck: Neck supple. No JVD present. No thyromegaly present.  Cardiovascular: Normal rate, regular rhythm and normal heart sounds.   No murmur heard. Pulmonary/Chest: Effort normal and breath sounds normal. No respiratory distress. She has no wheezes. She has no rales. She exhibits no tenderness.  Abdominal: Soft. Bowel sounds are normal. She exhibits no distension and no mass. There is no tenderness. There is no rebound and no guarding.  Genitourinary:  Pap taken. Bimanual normal.  Musculoskeletal: Normal range of motion. She exhibits no edema.  Lymphadenopathy:    She has no cervical adenopathy.  Neurological: She is alert and oriented to person, place, and time. She has normal reflexes. She displays normal reflexes. No cranial nerve deficit. Coordination normal.  Skin: Skin is warm and dry. No rash noted. She is not diaphoretic.  Psychiatric: She has a normal mood and affect. Her behavior is normal. Judgment and thought content normal.          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 Have given her small quantity of Klonopin to take sparingly and started her on Wellbutrin XL 150 mg daily which she says is worked well in the past for her. It also helps her with attention  issues.  Plan: Return in one year or as needed.

## 2014-04-07 ENCOUNTER — Encounter: Payer: Self-pay | Admitting: Internal Medicine
